# Patient Record
Sex: Male | Born: 1975 | Race: White | Hispanic: No | Marital: Single | Smoking: Current every day smoker
Health system: Southern US, Community
[De-identification: ages and names within clinical notes are randomized; demographics above are authoritative.]

## PROBLEM LIST (undated history)

## (undated) DIAGNOSIS — F1910 Other psychoactive substance abuse, uncomplicated: Secondary | ICD-10-CM

---

## 2016-05-26 ENCOUNTER — Encounter: Payer: Self-pay | Admitting: Emergency Medicine

## 2016-05-26 ENCOUNTER — Emergency Department
Admission: EM | Admit: 2016-05-26 | Discharge: 2016-05-28 | Disposition: A | Discharge status: Home / Self Care | Payer: Self-pay | Attending: Emergency Medicine | Admitting: Emergency Medicine

## 2016-05-26 DIAGNOSIS — F17290 Nicotine dependence, other tobacco product, uncomplicated: Secondary | ICD-10-CM | POA: Insufficient documentation

## 2016-05-26 DIAGNOSIS — Z5181 Encounter for therapeutic drug level monitoring: Secondary | ICD-10-CM | POA: Insufficient documentation

## 2016-05-26 DIAGNOSIS — F1010 Alcohol abuse, uncomplicated: Secondary | ICD-10-CM | POA: Insufficient documentation

## 2016-05-26 DIAGNOSIS — F17220 Nicotine dependence, chewing tobacco, uncomplicated: Secondary | ICD-10-CM | POA: Insufficient documentation

## 2016-05-26 DIAGNOSIS — F17210 Nicotine dependence, cigarettes, uncomplicated: Secondary | ICD-10-CM | POA: Insufficient documentation

## 2016-05-26 DIAGNOSIS — F1910 Other psychoactive substance abuse, uncomplicated: Secondary | ICD-10-CM | POA: Insufficient documentation

## 2016-05-26 DIAGNOSIS — R45851 Suicidal ideations: Secondary | ICD-10-CM

## 2016-05-26 HISTORY — DX: Other psychoactive substance abuse, uncomplicated: F19.10

## 2016-05-26 LAB — COMPREHENSIVE METABOLIC PANEL
ALBUMIN: 4.2 g/dL (ref 3.5–5.0)
ALK PHOS: 61 U/L (ref 38–126)
ALT: 23 U/L (ref 17–63)
AST: 23 U/L (ref 15–41)
Anion gap: 9 (ref 5–15)
BUN: 15 mg/dL (ref 6–20)
CO2: 24 mmol/L (ref 22–32)
CREATININE: 0.79 mg/dL (ref 0.61–1.24)
Calcium: 8.7 mg/dL — ABNORMAL LOW (ref 8.9–10.3)
Chloride: 104 mmol/L (ref 101–111)
GFR calc Af Amer: >60 mL/min (ref >60)
GFR calc non Af Amer: >60 mL/min (ref >60)
GLUCOSE: 138 mg/dL — AB (ref 65–99)
Potassium: 3.4 mmol/L — ABNORMAL LOW (ref 3.5–5.1)
SODIUM: 137 mmol/L (ref 135–145)
Total Bilirubin: 0.6 mg/dL (ref 0.3–1.2)
Total Protein: 7.6 g/dL (ref 6.5–8.1)

## 2016-05-26 LAB — CBC
HCT: 47.2 % (ref 40.0–52.0)
HEMOGLOBIN: 15.8 g/dL (ref 13.0–18.0)
MCH: 26.4 pg (ref 26.0–34.0)
MCHC: 33.4 g/dL (ref 32.0–36.0)
MCV: 79 fL — AB (ref 80.0–100.0)
Platelets: 284 10*3/uL (ref 150–440)
RBC: 5.98 MIL/uL — AB (ref 4.40–5.90)
RDW: 14.4 % (ref 11.5–14.5)
WBC: 13.6 10*3/uL — AB (ref 3.8–10.6)

## 2016-05-26 LAB — URINE DRUG SCREEN, QUALITATIVE (ARMC ONLY)
AMPHETAMINES, UR SCREEN: NOT DETECTED
Barbiturates, Ur Screen: NOT DETECTED
Benzodiazepine, Ur Scrn: NOT DETECTED
CANNABINOID 50 NG, UR ~~LOC~~: NOT DETECTED
COCAINE METABOLITE, UR ~~LOC~~: POSITIVE — AB
MDMA (ECSTASY) UR SCREEN: NOT DETECTED
Methadone Scn, Ur: NOT DETECTED
Opiate, Ur Screen: NOT DETECTED
PHENCYCLIDINE (PCP) UR S: NOT DETECTED
Tricyclic, Ur Screen: NOT DETECTED

## 2016-05-26 LAB — ACETAMINOPHEN LEVEL: Acetaminophen (Tylenol), Serum: <10 ug/mL — ABNORMAL LOW (ref 10–30)

## 2016-05-26 LAB — TROPONIN I: Troponin I: <0.03 ng/mL (ref <0.03)

## 2016-05-26 LAB — SALICYLATE LEVEL: Salicylate Lvl: <7 mg/dL (ref 2.8–30.0)

## 2016-05-26 LAB — ETHANOL: Alcohol, Ethyl (B): 16 mg/dL — ABNORMAL HIGH (ref <5)

## 2016-05-26 NOTE — BH Assessment (Signed)
Assessment Note  Greg Moreno is an 41 y.o. male who presents to the ER due to having thoughts of ending his life. He states, his primary stressors is his substance use. He denies having a plan to end his life but reports of having previous attempts. They occurred more than five years ago. He was sober for approximately two and a half years. He recently relapsed and it has resulted in him several losses. He lost his job, car and his fianc ended their relationship. Patient states, he relapsed on cocaine and alcohol. He was unable to quantify the amount and frequency. He shares he have insight about how his current emotional and mental state is due to the drug use and if he gets help for it, "I'll be okay."  Patient denies having a history of violence and aggression. He also denies current involvement with the legal system and DSS. During the interview, he was calm, cooperative and pleasant. He was able to give appropriate answers for the questions asked.   Diagnosis: Depression and Substance Use Disorder  Past Medical History:  Past Medical History:  Diagnosis Date  . Polysubstance abuse     History reviewed. No pertinent surgical history.  Family History: No family history on file.  Social History:  reports that he has been smoking Cigarettes.  His smokeless tobacco use includes Snuff and Chew. He reports that he drinks alcohol. He reports that he uses drugs, including Cocaine.  Additional Social History:  Alcohol / Drug Use Pain Medications: See PTA Prescriptions: See PTA Over the Counter: See PTA History of alcohol / drug use?: Yes Longest period of sobriety (when/how long): Two and half years Negative Consequences of Use: Personal relationships, Financial, Work / Programmer, multimedia, Armed forces operational officer Substance #1 Name of Substance 1: Alcohol 1 - Last Use / Amount: 05/26/2016 Substance #2 Name of Substance 2: Cocaine 2 - Last Use / Amount: 05/26/2016  CIWA: CIWA-Ar BP: 130/70 Pulse Rate: 97 COWS:     Allergies: No Known Allergies  Home Medications:  (Not in a hospital admission)  OB/GYN Status:  No LMP for male patient.  General Assessment Data Location of Assessment: [facility] ED TTS Assessment: In system Is this a Tele or Face-to-Face Assessment?: Face-to-Face Is this an Initial Assessment or a Re-assessment for this encounter?: Initial Assessment Marital status: Single Maiden name: n/a Is patient pregnant?: No Pregnancy Status: No Living Arrangements: Other (Comment) (Recently broke up with fianc) Can pt return to current living arrangement?: Yes Admission Status: Voluntary Is patient capable of signing voluntary admission?: Yes Referral Source: Self/Family/Friend Insurance type: None  Medical Screening Exam [facility] Walk-in ONLY) Medical Exam completed: Yes  Crisis Care Plan Living Arrangements: Other (Comment) (Recently broke up with fianc) Legal Guardian: Other: (Self) Name of Psychiatrist: Reports of none  Name of Therapist: Reports of none   Education Status Is patient currently in school?: No Current Grade: n/a Highest grade of school patient has completed: High School Diploma Name of school: n/a Contact person: n/a  Risk to self with the past 6 months Suicidal Ideation: Yes-Currently Present Has patient been a risk to self within the past 6 months prior to admission? : Yes Suicidal Intent: No Has patient had any suicidal intent within the past 6 months prior to admission? : No Is patient at risk for suicide?: Yes Suicidal Plan?: No Has patient had any suicidal plan within the past 6 months prior to admission? : No Access to Means: No What has been your use of drugs/alcohol within the  last 12 months?: Cocaine & Alcohol Previous Attempts/Gestures: Yes How many times?: 1 Other Self Harm Risks: Active Addiction Triggers for Past Attempts: Other (Comment) (Active Addiction) Intentional Self Injurious Behavior: None Family Suicide History: No Recent  stressful life event(s): Other (Comment), Loss (Comment), Job Loss, Financial Problems (Active Addiction) Persecutory voices/beliefs?: No Depression: Yes Depression Symptoms: Tearfulness, Isolating, Fatigue, Guilt, Loss of interest in usual pleasures, Feeling worthless/self pity Substance abuse history and/or treatment for substance abuse?: Yes Suicide prevention information given to non-admitted patients: Not applicable  Risk to Others within the past 6 months Homicidal Ideation: No Does patient have any lifetime risk of violence toward others beyond the six months prior to admission? : No Thoughts of Harm to Others: No Current Homicidal Intent: No Current Homicidal Plan: No Access to Homicidal Means: No Identified Victim: Reports of none History of harm to others?: No Assessment of Violence: None Noted Violent Behavior Description: Reports of none Does patient have access to weapons?: No Criminal Charges Pending?: No Does patient have a court date: No Is patient on probation?: No  Psychosis Hallucinations: None noted Delusions: None noted  Mental Status Report Appearance/Hygiene: Unremarkable, In scrubs Eye Contact: Fair Motor Activity: Freedom of movement, Unremarkable Speech: Logical/coherent, Unremarkable Level of Consciousness: Alert Mood: Depressed, Sad, Pleasant, Anxious Affect: Appropriate to circumstance, Depressed, Sad Anxiety Level: Minimal Thought Processes: Coherent, Relevant Judgement: Unimpaired Orientation: Person, Place, Time, Situation, Appropriate for developmental age Obsessive Compulsive Thoughts/Behaviors: Minimal  Cognitive Functioning Concentration: Normal Memory: Recent Intact, Remote Intact IQ: Average Insight: Fair Impulse Control: Fair Appetite: Fair Weight Loss: 0 Weight Gain: 0 Sleep: Decreased Total Hours of Sleep: 0 ("I haven't slept in four") Vegetative Symptoms: None  ADLScreening [facility] Assessment Services) Patient's cognitive  ability adequate to safely complete daily activities?: Yes Patient able to express need for assistance with ADLs?: Yes Independently performs ADLs?: Yes (appropriate for developmental age)  Prior Inpatient Therapy Prior Inpatient Therapy: Yes Prior Therapy Dates: Unable to remember the dates Prior Therapy Facilty/Provider(s): Substance Facility Reason for Treatment: Dual Dx (Depression & Substance Use)  Prior Outpatient Therapy Prior Outpatient Therapy: No Prior Therapy Dates: Reports of none Prior Therapy Facilty/Provider(s): Reports of none Reason for Treatment: Reports of none Does patient have an ACCT team?: No Does patient have Intensive In-House Services?  : No Does patient have [facility] services? : No Does patient have P4CC services?: No  ADL Screening (condition at time of admission) Patient's cognitive ability adequate to safely complete daily activities?: Yes Is the patient deaf or have difficulty hearing?: No Does the patient have difficulty seeing, even when wearing glasses/contacts?: No Does the patient have difficulty concentrating, remembering, or making decisions?: No Patient able to express need for assistance with ADLs?: Yes Does the patient have difficulty dressing or bathing?: No Independently performs ADLs?: Yes (appropriate for developmental age) Does the patient have difficulty walking or climbing stairs?: No Weakness of Legs: None Weakness of Arms/Hands: None  Home Assistive Devices/Equipment Home Assistive Devices/Equipment: None  Therapy Consults (therapy consults require a physician order) PT Evaluation Needed: No OT Evalulation Needed: No SLP Evaluation Needed: No Abuse/Neglect Assessment (Assessment to be complete while patient is alone) Physical Abuse: Denies Verbal Abuse: Denies Sexual Abuse: Denies Exploitation of patient/patient's resources: Denies Self-Neglect: Denies Values / Beliefs Cultural Requests During Hospitalization:  None Spiritual Requests During Hospitalization: None Consults Spiritual Care Consult Needed: No Social Work Consult Needed: No Merchant navy officer (For Healthcare) Does Patient Have a Medical Advance Directive?: No    Additional Information 1:1  In Past 12 Months?: No CIRT Risk: No Elopement Risk: No Does patient have medical clearance?: Yes  Child/Adolescent Assessment Running Away Risk: Denies (Patient is an adult)  Disposition:  Disposition Initial Assessment Completed for this Encounter: Yes Disposition of Patient: Other dispositions  On Site Evaluation by:   Reviewed with Physician:    Lilyan Gilford MS, LCAS, LPC, NCC, CCSI Therapeutic Triage Specialist 05/26/2016 6:55 PM

## 2016-05-26 NOTE — ED Notes (Signed)

## 2016-05-26 NOTE — ED Notes (Signed)
Patient alert and oriented. States he feel "shame and guilt" because of recent relapse. Patient has SI but no active plan.  Patient reports drinking a pint of alcohol daily x one week and also using unknown amount of crack cocaine.  Per patient, no seizures with any previous detox. He does get shaky during withdrawal. Will closely monitor sxs and report to MD as needed.  Patient received meal tray and beverage. He also took a shower.Maintained on 15 minute checks and observation by security camera for safety.

## 2016-05-26 NOTE — ED Notes (Signed)
FIRST NURSE NOTE: Reports recently relapsing, states he has been feeling suicidal for the past 4-5 days. Tearful in triage. States he is currently homeless at this time. Pt states he was dropped off.

## 2016-05-26 NOTE — ED Notes (Signed)
Pt moved to room 22 and report received from [place]. Pt here voluntarily for si after a cocaine/etoh binge. Seen by er doc. telepsych consult ordered.

## 2016-05-26 NOTE — ED Notes (Signed)

## 2016-05-26 NOTE — ED Notes (Signed)
Pt. Alert and oriented, warm and dry, in no distress. Pt. Denies SI, HI, and AVH. Pt. Encouraged to let nursing staff know of any concerns or needs. 

## 2016-05-26 NOTE — ED Triage Notes (Signed)
Pt states approx 8-9 days ago he relapsed on crack cocaine and alcohol. Pt states he binged for approx 4-5 days before going back home. Pt states, "I've lost everything, and I mean, I just don't have any reason to live". Pt states +SI, denies plan at this time. Pt is alert and oriented, calm and cooperative at this time.

## 2016-05-26 NOTE — BH Assessment (Signed)
Referral information for Psychiatric Hospitalization faxed to;    [facility] [phone])   [facility] [phone])

## 2016-05-26 NOTE — ED Provider Notes (Signed)
[facility] Emergency Department Provider Note  ____________________________________________  Time seen: Approximately 2:50 PM  I have reviewed the triage vital signs and the nursing notes.   HISTORY  Chief Complaint Suicidal    HPI Greg Moreno is a 41 y.o. male who reports that he has been abusing alcohol and crack cocaine for the past 8 days. 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 He reports that he has some passing suicidal thoughts, but no plan and no intention to harm himself. No HI or hallucinations. Last cocaine and alcohol were this morning. Also reports that he adamantly is been getting some vague chest pain, most recently about 1 or 2 AM today. No chest pain currently. No shortness of breath vomiting or diaphoresis.     Past Medical History:  Diagnosis Date  . Polysubstance abuse      There are no active problems to display for this patient.    History reviewed. No pertinent surgical history.   Prior to Admission medications   Not on File     Allergies Patient has no known allergies.   No family history on file.  Social History Social History  Substance Use Topics  . Smoking status: Current Every Day Smoker    Types: Cigarettes  . Smokeless tobacco: Current User    Types: Snuff, Chew  . Alcohol use Yes     Comment: 1/5 every day    Review of Systems  Constitutional:   No fever or chills.  ENT:   No sore throat. No rhinorrhea. Cardiovascular:   Positive as above chest pain. Respiratory:   No dyspnea or cough. Gastrointestinal:   Negative for abdominal pain, vomiting and diarrhea.  Genitourinary:   Negative for dysuria or difficulty urinating. Musculoskeletal:   Negative for focal pain or swelling Neurological:   Negative for headaches 10-point ROS  otherwise negative.  ____________________________________________   PHYSICAL EXAM:  VITAL SIGNS: ED Triage Vitals [05/26/16 1349]  Enc Vitals Group     BP 130/85     Pulse Rate 89     Resp 18     Temp 98.2 F (36.8 C)     Temp Source Oral     SpO2 97 %     Weight 270 lb (122.5 kg)     Height 6' (1.829 m)     Head Circumference      Peak Flow      Pain Score 6     Pain Loc      Pain Edu?      Excl. in GC?     Vital signs reviewed, nursing assessments reviewed.   Constitutional:   Alert and oriented. Well appearing and in no distress. Eyes:   No scleral icterus. No conjunctival pallor. PERRL. EOMI.  No nystagmus. ENT   Head:   Normocephalic and atraumatic.   Nose:   No congestion/rhinnorhea. No septal hematoma   Mouth/Throat:   MMM, no pharyngeal erythema. No peritonsillar mass.    Neck:   No stridor. No SubQ emphysema. No meningismus. Hematological/Lymphatic/Immunilogical:   No cervical lymphadenopathy. Cardiovascular:   RRR. Symmetric bilateral radial and DP pulses.  No murmurs.  Respiratory:   Normal respiratory effort without tachypnea nor retractions. Breath sounds are clear and equal bilaterally. No wheezes/rales/rhonchi. Gastrointestinal:   Soft and nontender. Non distended. There is no  CVA tenderness.  No rebound, rigidity, or guarding. Genitourinary:   deferred Musculoskeletal:   Normal range of motion in all extremities. No joint effusions.  No lower extremity tenderness.  No edema. Neurologic:   Normal speech and language.  CN 2-10 normal. Motor grossly intact. No gross focal neurologic deficits are appreciated.  Skin:    Skin is warm, dry and intact. No rash noted.  No petechiae, purpura, or bullae.  ____________________________________________    LABS (pertinent positives/negatives) (all labs ordered are listed, but only abnormal results are displayed) Labs Reviewed  COMPREHENSIVE METABOLIC PANEL - Abnormal; Notable for the following:        Result Value   Potassium 3.4 (*)    Glucose, Bld 138 (*)    Calcium 8.7 (*)    All other components within normal limits  ETHANOL - Abnormal; Notable for the following:    Alcohol, Ethyl (B) 16 (*)    All other components within normal limits  ACETAMINOPHEN LEVEL - Abnormal; Notable for the following:    Acetaminophen (Tylenol), Serum <10 (*)    All other components within normal limits  CBC - Abnormal; Notable for the following:    WBC 13.6 (*)    RBC 5.98 (*)    MCV 79.0 (*)    All other components within normal limits  SALICYLATE LEVEL  URINE DRUG SCREEN, QUALITATIVE ([facility] ONLY)  TROPONIN I   ____________________________________________   EKG    ____________________________________________    RADIOLOGY  No results found.  ____________________________________________   PROCEDURES Procedures  ____________________________________________   INITIAL IMPRESSION / ASSESSMENT AND PLAN / ED COURSE  Pertinent labs & imaging results that were available during my care of the patient were reviewed by me and considered in my medical decision making (see chart for details).  Patient well appearing no acute distress, presents with suicidal thoughts, does not meet commitment criteria, not an imminent danger to himself or others. He agrees to have a psychiatry evaluation today.  Regarding his polysubstance abuse, he denies a history of withdrawal symptoms other than anxiousness. Currently there is no evidence of withdrawal. Vital signs are normal. We'll check troponin and EKG for his chest pain, but a result 12 hours ago so a single troponin will be sufficient.  After EKG troponin and psychiatry evaluation are completed, disposition can be determined. Care be signed out to oncoming physician at 3:00 PM.         ____________________________________________   FINAL CLINICAL IMPRESSION(S) / ED DIAGNOSES  Final diagnoses:  Suicidal thoughts  Polysubstance abuse   Alcohol abuse      New Prescriptions   No medications on file     Portions of this note were generated with dragon dictation software. Dictation errors may occur despite best attempts at proofreading.    Sharman Cheek, MD 05/26/16 1452

## 2016-05-27 DIAGNOSIS — F322 Major depressive disorder, single episode, severe without psychotic features: Secondary | ICD-10-CM

## 2016-05-27 MED ORDER — IBUPROFEN 600 MG PO TABS
ORAL_TABLET | ORAL | Status: AC
  Administered 2016-05-27: 600 mg via ORAL
  Filled 2016-05-27: qty 1

## 2016-05-27 MED ORDER — IBUPROFEN 600 MG PO TABS
600.0000 mg | ORAL_TABLET | Freq: Four times a day (QID) | ORAL | Status: DC | PRN
  Administered 2016-05-27: 600 mg via ORAL

## 2016-05-27 NOTE — ED Notes (Signed)
Patient sleeping when this nurse arrived on unit.  Patient is to be transferred to [facility] once report is called.  Patient resting comfortably.  Requested food earlier in the shift.  Patient recently relapsed on cocaine which resulted in him losing his job, car and fiance.  Patient denies any hx of violence or aggression.  Per report, patient has been calm and cooperative; his behavior has been appropriate.

## 2016-05-27 NOTE — Consult Note (Signed)
[facility]iatry Consult   Reason for Consult:  Consult for 41 year old man with a history of substance abuse came into the hospital with multiple symptoms of depression Referring Physician:  Reita Cliche Patient Identification: Greg Moreno MRN:  [id number] Principal Diagnosis: Severe major depression, single episode, without psychotic features [facility]) Diagnosis:   Patient Active Problem List   Diagnosis Date Noted  . Severe major depression, single episode, without psychotic features ([place]) [F32.2] 05/27/2016  . Cocaine abuse [F14.10] 05/27/2016  . Alcohol abuse [F10.10] 05/27/2016    Total Time spent with patient: 1 hour  Subjective:   Greg Moreno is a 41 y.o. male patient admitted with "I feel hopeless".  HPI:  Patient interviewed chart reviewed. This 41 year old man came to the emergency room over the weekend and was seen by Specialist on call but had not been admitted yet. Reevaluated today the patient says that he had been clean for 2-1/2 years when he relapsed about 10 days ago. He had been drinking heavily and doing crack cocaine for the last 9 days. During that time he lost his job, lost his car, got thrown out of the house by his girlfriend. Describes himself as hopeless. [name] he was having suicidal thoughts with thoughts of killing himself by drug overdose. Denied any psychotic symptoms. Denied any thoughts of violence. Not currently on any psychiatric medicine or getting any outpatient treatment. Sleep is been very poor and appetite is been poor for several days. Patient continues to endorse today feeling hopeless sad and suicidal.  Social history: Previously had been working but lost his job during his binge. Has been living with his "fianc" but she apparently has put him out. Later in the afternoon however she said, round and seems to of changed her mind.  Medical history: Denies knowing of any significant medical history except for chronic joint pain and  arthritis.  Substance abuse history: Describes himself as having had a long history of abuse of drugs and alcohol but being clean for 2-1/2 years before his relapse 9 days ago. No history of DTs or seizures.  Past Psychiatric History: Patient reports he had a suicide attempt several years ago by drug overdose. He says he's been on multiple antidepressants in the past but never thought they were helpful. Despite this he claims he was diagnosed with bipolar disorder. No history of violence to others. No history of psychosis reported.  Risk to Self: Suicidal Ideation: Yes-Currently Present Suicidal Intent: No Is patient at risk for suicide?: Yes Suicidal Plan?: No Access to Means: No What has been your use of drugs/alcohol within the last 12 months?: Cocaine & Alcohol How many times?: 1 Other Self Harm Risks: Active Addiction Triggers for Past Attempts: Other (Comment) (Active Addiction) Intentional Self Injurious Behavior: None Risk to Others: Homicidal Ideation: No Thoughts of Harm to Others: No Current Homicidal Intent: No Current Homicidal Plan: No Access to Homicidal Means: No Identified Victim: Reports of none History of harm to others?: No Assessment of Violence: None Noted Violent Behavior Description: Reports of none Does patient have access to weapons?: No Criminal Charges Pending?: No Does patient have a court date: No Prior Inpatient Therapy: Prior Inpatient Therapy: Yes Prior Therapy Dates: Unable to remember the dates Prior Therapy Facilty/Provider(s): Substance Facility Reason for Treatment: Dual Dx (Depression & Substance Use) Prior Outpatient Therapy: Prior Outpatient Therapy: No Prior Therapy Dates: Reports of none Prior Therapy Facilty/Provider(s): Reports of none Reason for Treatment: Reports of none Does patient have an ACCT team?: No Does  patient have Intensive In-House Services?  : No Does patient have [facility] services? : No Does patient have P4CC services?:  No  Past Medical History:  Past Medical History:  Diagnosis Date  . Polysubstance abuse    History reviewed. No pertinent surgical history. Family History: No family history on file. Family Psychiatric  History: Denies knowing of any family history Social History:  History  Alcohol Use  . Yes    Comment: 1/5 every day     History  Drug Use  . Types: Cocaine    Comment: Pt states smokes crack cocaine    Social History   Social History  . Marital status: Single    Spouse name: N/A  . Number of children: N/A  . Years of education: N/A   Social History Main Topics  . Smoking status: Current Every Day Smoker    Types: Cigarettes  . Smokeless tobacco: Current User    Types: Snuff, Chew  . Alcohol use Yes     Comment: 1/5 every day  . Drug use: Yes    Types: Cocaine     Comment: Pt states smokes crack cocaine  . Sexual activity: Not Asked   Other Topics Concern  . None   Social History Narrative  . None   Additional Social History:    Allergies:  No Known Allergies  Labs:  Results for orders placed or performed during the hospital encounter of 05/26/16 (from the past 48 hour(s))  Comprehensive metabolic panel     Status: Abnormal   Collection Time: 05/26/16  1:51 PM  Result Value Ref Range   Sodium 137 135 - 145 mmol/L   Potassium 3.4 (L) 3.5 - 5.1 mmol/L   Chloride 104 101 - 111 mmol/L   CO2 24 22 - 32 mmol/L   Glucose, Bld 138 (H) 65 - 99 mg/dL   BUN 15 6 - 20 mg/dL   Creatinine, Ser 0.79 0.61 - 1.24 mg/dL   Calcium 8.7 (L) 8.9 - 10.3 mg/dL   Total Protein 7.6 6.5 - 8.1 g/dL   Albumin 4.2 3.5 - 5.0 g/dL   AST 23 15 - 41 U/L   ALT 23 17 - 63 U/L   Alkaline Phosphatase 61 38 - 126 U/L   Total Bilirubin 0.6 0.3 - 1.2 mg/dL   GFR calc non Af Amer >60 >60 mL/min   GFR calc Af Amer >60 >60 mL/min    Comment: (NOTE) The eGFR has been calculated using the CKD EPI equation. This calculation has not been validated in all clinical situations. eGFR's  persistently <60 mL/min signify possible Chronic Kidney Disease.    Anion gap 9 5 - 15  Ethanol     Status: Abnormal   Collection Time: 05/26/16  1:51 PM  Result Value Ref Range   Alcohol, Ethyl (B) 16 (H) <5 mg/dL    Comment:        LOWEST DETECTABLE LIMIT FOR SERUM ALCOHOL IS 5 mg/dL FOR MEDICAL PURPOSES ONLY   Salicylate level     Status: None   Collection Time: 05/26/16  1:51 PM  Result Value Ref Range   Salicylate Lvl <3.5 2.8 - 30.0 mg/dL  Acetaminophen level     Status: Abnormal   Collection Time: 05/26/16  1:51 PM  Result Value Ref Range   Acetaminophen (Tylenol), Serum <10 (L) 10 - 30 ug/mL    Comment:        THERAPEUTIC CONCENTRATIONS VARY SIGNIFICANTLY. A RANGE OF 10-30 ug/mL MAY BE AN  EFFECTIVE CONCENTRATION FOR MANY PATIENTS. HOWEVER, SOME ARE BEST TREATED AT CONCENTRATIONS OUTSIDE THIS RANGE. ACETAMINOPHEN CONCENTRATIONS >150 ug/mL AT 4 HOURS AFTER INGESTION AND >50 ug/mL AT 12 HOURS AFTER INGESTION ARE OFTEN ASSOCIATED WITH TOXIC REACTIONS.   cbc     Status: Abnormal   Collection Time: 05/26/16  1:51 PM  Result Value Ref Range   WBC 13.6 (H) 3.8 - 10.6 K/uL   RBC 5.98 (H) 4.40 - 5.90 MIL/uL   Hemoglobin 15.8 13.0 - 18.0 g/dL   HCT 47.2 40.0 - 52.0 %   MCV 79.0 (L) 80.0 - 100.0 fL   MCH 26.4 26.0 - 34.0 pg   MCHC 33.4 32.0 - 36.0 g/dL   RDW 14.4 11.5 - 14.5 %   Platelets 284 150 - 440 K/uL  Troponin I     Status: None   Collection Time: 05/26/16  1:51 PM  Result Value Ref Range   Troponin I <0.03 <0.03 ng/mL  Urine Drug Screen, Qualitative     Status: Abnormal   Collection Time: 05/26/16  5:00 PM  Result Value Ref Range   Tricyclic, Ur Screen NONE DETECTED NONE DETECTED   Amphetamines, Ur Screen NONE DETECTED NONE DETECTED   MDMA (Ecstasy)Ur Screen NONE DETECTED NONE DETECTED   Cocaine Metabolite,Ur [place] POSITIVE (A) NONE DETECTED   Opiate, Ur Screen NONE DETECTED NONE DETECTED   Phencyclidine (PCP) Ur S NONE DETECTED NONE DETECTED    Cannabinoid 50 Ng, Ur [place] NONE DETECTED NONE DETECTED   Barbiturates, Ur Screen NONE DETECTED NONE DETECTED   Benzodiazepine, Ur Scrn NONE DETECTED NONE DETECTED   Methadone Scn, Ur NONE DETECTED NONE DETECTED    Comment: (NOTE) 297  Tricyclics, urine               Cutoff 1000 ng/mL 200  Amphetamines, urine             Cutoff 1000 ng/mL 300  MDMA (Ecstasy), urine           Cutoff 500 ng/mL 400  Cocaine Metabolite, urine       Cutoff 300 ng/mL 500  Opiate, urine                   Cutoff 300 ng/mL 600  Phencyclidine (PCP), urine      Cutoff 25 ng/mL 700  Cannabinoid, urine              Cutoff 50 ng/mL 800  Barbiturates, urine             Cutoff 200 ng/mL 900  Benzodiazepine, urine           Cutoff 200 ng/mL 1000 Methadone, urine                Cutoff 300 ng/mL 1100 1200 The urine drug screen provides only a preliminary, unconfirmed 1300 analytical test result and should not be used for non-medical 1400 purposes. Clinical consideration and professional judgment should 1500 be applied to any positive drug screen result due to possible 1600 interfering substances. A more specific alternate chemical method 1700 must be used in order to obtain a confirmed analytical result.  1800 Gas chromato graphy / mass spectrometry (GC/MS) is the preferred 1900 confirmatory method.     Current Facility-Administered Medications  Medication Dose Route Frequency Provider Last Rate Last Dose  . ibuprofen (ADVIL,MOTRIN) tablet 600 mg  600 mg Oral Q6H PRN [name], MD   600 mg at 05/27/16 1534   No current outpatient prescriptions on  file.    Musculoskeletal: Strength & Muscle Tone: within normal limits Gait & Station: normal Patient leans: N/A  Psychiatric Specialty Exam: Physical Exam  Nursing note and vitals reviewed. Constitutional: He appears well-developed and well-nourished.  HENT:  Head: Normocephalic and atraumatic.  Eyes: Conjunctivae are normal. Pupils are equal, round, and  reactive to light.  Neck: Normal range of motion.  Cardiovascular: Regular rhythm and normal heart sounds.   Respiratory: Effort normal. No respiratory distress.  GI: Soft.  Musculoskeletal: Normal range of motion.  Neurological: He is alert.  Skin: Skin is warm and dry.  Psychiatric: His speech is delayed. He is slowed. Cognition and memory are normal. He expresses impulsivity. He exhibits a depressed mood. He expresses suicidal ideation. He expresses suicidal plans.    Review of Systems  Constitutional: Negative.   HENT: Negative.   Eyes: Negative.   Respiratory: Negative.   Cardiovascular: Negative.   Gastrointestinal: Negative.   Musculoskeletal: Positive for joint pain.  Skin: Negative.   Neurological: Negative.   Psychiatric/Behavioral: Positive for depression, substance abuse and suicidal ideas. Negative for hallucinations and memory loss. The patient is nervous/anxious and has insomnia.     Blood pressure 124/78, pulse 84, temperature 98.1 F (36.7 C), temperature source Oral, resp. rate 18, height 6' (1.829 m), weight 122.5 kg (270 lb), SpO2 99 %.Body mass index is 36.62 kg/m.  General Appearance: Casual  Eye Contact:  Fair  Speech:  Clear and Coherent  Volume:  Normal  Mood:  Anxious and Depressed  Affect:  Constricted and Tearful  Thought Process:  Goal Directed  Orientation:  Full (Time, Place, and Person)  Thought Content:  Logical  Suicidal Thoughts:  Yes.  with intent/plan  Homicidal Thoughts:  No  Memory:  Immediate;   Good Recent;   Fair Remote;   Fair  Judgement:  Fair  Insight:  Fair  Psychomotor Activity:  Decreased  Concentration:  Concentration: Good  Recall:  Good  Fund of Knowledge:  Good  Language:  Good  Akathisia:  No  Handed:  Right  AIMS (if indicated):     Assets:  Desire for Improvement Resilience  ADL's:  Intact  Cognition:  WNL  Sleep:        Treatment Plan Summary: Daily contact with patient to assess and evaluate symptoms  and progress in treatment, Medication management and Plan 41 year old man presented to the emergency room after a heavy binge on alcohol and cocaine. Continues to report suicidal ideation. Patient had initially been recommended by [facility] for admission to a "substance abuse facility". Such a specific thing of course is really not available for [place]. On reevaluation today however the patient continues to have multiple symptoms of depression with suicidal ideation. He will be admitted to the psychiatry unit downstairs with a diagnosis of major depression. 15 minute checks in place. Full labs will be evaluated. Patient agreeable to plan. Case reviewed with TTS and ER physician.  Disposition: Recommend psychiatric Inpatient admission when medically cleared. Supportive therapy provided about ongoing stressors.  Alethia Berthold, MD 05/27/2016 7:29 PM

## 2016-05-27 NOTE — ED Notes (Signed)
BEHAVIORAL HEALTH ROUNDING  Patient sleeping: No.  Patient alert and oriented: yes  Behavior appropriate: Yes. ; If no, describe:  Nutrition and fluids offered: Yes  Toileting and hygiene offered: Yes  Sitter present: not applicable, Q 15 min safety rounds and observation via security camera. Law enforcement present: Yes ODS  

## 2016-05-27 NOTE — ED Notes (Signed)
Pt has requested for another eval by psych because he is feeling much better and denies si to me at this time

## 2016-05-27 NOTE — ED Notes (Signed)
ENVIRONMENTAL ASSESSMENT  Potentially harmful objects out of patient reach: Yes.  Personal belongings secured: Yes.  Patient dressed in hospital provided attire only: Yes.  Plastic bags out of patient reach: Yes.  Patient care equipment (cords, cables, call bells, lines, and drains) shortened, removed, or accounted for: Yes.  Equipment and supplies removed from bottom of stretcher: Yes.  Potentially toxic materials out of patient reach: Yes.  Sharps container removed or out of patient reach: Yes.   BEHAVIORAL HEALTH ROUNDING  Patient sleeping: No.  Patient alert and oriented: yes  Behavior appropriate: Yes. ; If no, describe:  Nutrition and fluids offered: Yes  Toileting and hygiene offered: Yes  Sitter present: not applicable, Q 15 min safety rounds and observation via security camera. Law enforcement present: Yes ODS   ED BHU PLACEMENT JUSTIFICATION  Is the patient under IVC or is there intent for IVC: No.  Is the patient medically cleared: Yes.  Is there vacancy in the ED BHU: Yes.  Is the population mix appropriate for patient: Yes.  Is the patient awaiting placement in inpatient or outpatient setting: Yes.  Has the patient had a psychiatric consult: Yes.  Survey of unit performed for contraband, proper placement and condition of furniture, tampering with fixtures in bathroom, shower, and each patient room: Yes. ; Findings: All clear  APPEARANCE/BEHAVIOR  calm, cooperative and adequate rapport can be established  NEURO ASSESSMENT  Orientation: time, place and person  Hallucinations: No.None noted (Hallucinations)  Speech: Normal  Gait: normal  RESPIRATORY ASSESSMENT  WNL  CARDIOVASCULAR ASSESSMENT  WNL  GASTROINTESTINAL ASSESSMENT  WNL  EXTREMITIES  WNL  PLAN OF CARE  Provide calm/safe environment. Vital signs assessed TID. ED BHU Assessment once each 12-hour shift. Collaborate with TTS daily or as condition indicates. Assure the ED provider has rounded once each  shift. Provide and encourage hygiene. Provide redirection as needed. Assess for escalating behavior; address immediately and inform ED provider.  Assess family dynamic and appropriateness for visitation as needed: Yes. ; If necessary, describe findings:  Educate the patient/family about BHU procedures/visitation: Yes. ; If necessary, describe findings: Pt is calm and cooperative at this time. Contracts for safety with this RN. Pt understanding and accepting of unit procedures/rules. Will continue to monitor with Q 15 min safety rounds and observation via security camera.

## 2016-05-27 NOTE — ED Provider Notes (Signed)
-----------------------------------------   6:42 AM on 05/27/2016 -----------------------------------------   Blood pressure 126/90, pulse 95, temperature 98.2 F (36.8 C), temperature source Oral, resp. rate 18, height 6' (1.829 m), weight 270 lb (122.5 kg), SpO2 95 %.  The patient had no acute events since last update.  Calm and cooperative at this time.  Disposition is pending Psychiatry/Behavioral Medicine team recommendations.     Irean Hong, MD 05/27/16 [phone]

## 2016-05-28 ENCOUNTER — Inpatient Hospital Stay
Admission: AD | Admit: 2016-05-28 | Discharge: 2016-05-30 | DRG: 881 | Disposition: A | Discharge status: Home / Self Care | Payer: No Typology Code available for payment source | Source: Intra-hospital | Attending: Psychiatry | Admitting: Psychiatry

## 2016-05-28 DIAGNOSIS — Z62810 Personal history of physical and sexual abuse in childhood: Secondary | ICD-10-CM | POA: Diagnosis present

## 2016-05-28 DIAGNOSIS — G4700 Insomnia, unspecified: Secondary | ICD-10-CM | POA: Diagnosis present

## 2016-05-28 DIAGNOSIS — F1420 Cocaine dependence, uncomplicated: Secondary | ICD-10-CM | POA: Diagnosis present

## 2016-05-28 DIAGNOSIS — M1990 Unspecified osteoarthritis, unspecified site: Secondary | ICD-10-CM | POA: Diagnosis present

## 2016-05-28 DIAGNOSIS — F1020 Alcohol dependence, uncomplicated: Secondary | ICD-10-CM | POA: Diagnosis present

## 2016-05-28 DIAGNOSIS — R45851 Suicidal ideations: Secondary | ICD-10-CM | POA: Diagnosis present

## 2016-05-28 DIAGNOSIS — F17210 Nicotine dependence, cigarettes, uncomplicated: Secondary | ICD-10-CM | POA: Diagnosis present

## 2016-05-28 DIAGNOSIS — Z915 Personal history of self-harm: Secondary | ICD-10-CM

## 2016-05-28 DIAGNOSIS — Z560 Unemployment, unspecified: Secondary | ICD-10-CM

## 2016-05-28 DIAGNOSIS — F329 Major depressive disorder, single episode, unspecified: Secondary | ICD-10-CM | POA: Diagnosis present

## 2016-05-28 LAB — LIPID PANEL
CHOL/HDL RATIO: 3.3 ratio
Cholesterol: 137 mg/dL (ref 0–200)
HDL: 42 mg/dL (ref >40)
LDL CALC: 76 mg/dL (ref 0–99)
Triglycerides: 97 mg/dL (ref <150)
VLDL: 19 mg/dL (ref 0–40)

## 2016-05-28 LAB — TSH: TSH: 1.288 u[IU]/mL (ref 0.350–4.500)

## 2016-05-28 MED ORDER — IBUPROFEN 600 MG PO TABS
600.0000 mg | ORAL_TABLET | Freq: Four times a day (QID) | ORAL | Status: DC | PRN
  Administered 2016-05-28: 600 mg via ORAL
  Filled 2016-05-28: qty 1

## 2016-05-28 MED ORDER — NICOTINE 21 MG/24HR TD PT24
21.0000 mg | MEDICATED_PATCH | Freq: Every day | TRANSDERMAL | Status: DC
  Administered 2016-05-28: 21 mg via TRANSDERMAL
  Filled 2016-05-28: qty 1

## 2016-05-28 MED ORDER — ALUM & MAG HYDROXIDE-SIMETH 200-200-20 MG/5ML PO SUSP: 30.0000 mL | ORAL | Status: DC | PRN

## 2016-05-28 MED ORDER — ACETAMINOPHEN 500 MG PO TABS
1000.0000 mg | ORAL_TABLET | Freq: Four times a day (QID) | ORAL | Status: DC | PRN
  Administered 2016-05-29: 1000 mg via ORAL
  Filled 2016-05-28: qty 2

## 2016-05-28 MED ORDER — MELOXICAM 7.5 MG PO TABS
7.5000 mg | ORAL_TABLET | Freq: Two times a day (BID) | ORAL | Status: DC
  Administered 2016-05-28 – 2016-05-30 (×4): 7.5 mg via ORAL
  Filled 2016-05-28 (×4): qty 1

## 2016-05-28 MED ORDER — ACETAMINOPHEN 325 MG PO TABS: 650.0000 mg | ORAL_TABLET | Freq: Four times a day (QID) | ORAL | Status: DC | PRN

## 2016-05-28 MED ORDER — TRAZODONE HCL 100 MG PO TABS
100.0000 mg | ORAL_TABLET | Freq: Every day | ORAL | Status: DC
  Filled 2016-05-28: qty 1

## 2016-05-28 MED ORDER — MAGNESIUM HYDROXIDE 400 MG/5ML PO SUSP
30.0000 mL | Freq: Every day | ORAL | Status: DC | PRN
Start: 2016-05-28 — End: 2016-05-30

## 2016-05-28 NOTE — [facility] Suicide Risk Assessment (Signed)
[facility] INPATIENT:  Family/Significant Other Suicide Prevention Education  Suicide Prevention Education:  Education Completed; Nat Math, fiancee, [phone], has been identified by the patient as the family member/significant other with whom the patient will be residing, and identified as the person(s) who will aid the patient in the event of a mental health crisis (suicidal ideations/suicide attempt).  With written consent from the patient, the family member/significant other has been provided the following suicide prevention education, prior to the and/or following the discharge of the patient.  The suicide prevention education provided includes the following:  Suicide risk factors  Suicide prevention and interventions  National Suicide Hotline telephone number  [facility] assessment telephone number  [facility] Emergency Assistance 911  [facility] and/or Residential Mobile Crisis Unit telephone number  Request made of family/significant other to:  Remove weapons (e.g., guns, rifles, knives), all items previously/currently identified as safety concern.  Herbert Seta reports client has not access to guns that she is aware of.  Remove drugs/medications (over-the-counter, prescriptions, illicit drugs), all items previously/currently identified as a safety concern.  The family member/significant other verbalizes understanding of the suicide prevention education information provided.  The family member/significant other agrees to remove the items of safety concern listed above.  Lorri Frederick, LCSW 05/28/2016, 4:12 PM

## 2016-05-28 NOTE — [facility] Group Notes (Signed)
[facility] LCSW Group Therapy Note  Date/Time: 05/28/16, 0930  Type of Therapy/Topic:  Group Therapy:  Feelings about Diagnosis  Participation Level:  Did Not Attend   Mood:   Description of Group:    This group will allow patients to explore their thoughts and feelings about diagnoses they have received. Patients will be guided to explore their level of understanding and acceptance of these diagnoses. Facilitator will encourage patients to process their thoughts and feelings about the reactions of others to their diagnosis, and will guide patients in identifying ways to discuss their diagnosis with significant others in their lives. This group will be process-oriented, with patients participating in exploration of their own experiences as well as giving and receiving support and challenge from other group members.   Therapeutic Goals: 1. Patient will demonstrate understanding of diagnosis as evidence by identifying two or more symptoms of the disorder:  2. Patient will be able to express two feelings regarding the diagnosis 3. Patient will demonstrate ability to communicate their needs through discussion and/or role plays  Summary of Patient Progress:        Therapeutic Modalities:   Cognitive Behavioral Therapy Brief Therapy Feelings Identification   Greg Pearlene Teat, LCSW 

## 2016-05-28 NOTE — Progress Notes (Signed)
Recreation Therapy Notes  Date: 04.17.18 Time: 3"00 pm Location: Craft Room  Group Topic: Communication  Goal Area(s) Addresses:  Patient will use effective communication skills. Patient will verbalize importance of using communication skills post d/c.  Behavioral Response: Did not attend   Intervention: Blind Drawing  Activity: Patients were put in pairs back-to-back. One patient was given a picture and was instructed to give the other patient directions on how to draw the picture without saying what the picture was. After both patients had a change at giving directions, patients were able to do a second round when they were side-by-side instead of back-to-back.  Education: LRT educated patients on Manufacturing systems engineer.  Education Outcome: Patient did not attend group.  Clinical Observations/Feedback: Patient left with Dr. Ardyth Harps when group started. Patient did not return to group.  Jacquelynn Cree, LRT/CTRS 05/28/2016 3:42 PM

## 2016-05-28 NOTE — ED Notes (Signed)
Report given to Abbie at [facility].  Patient will be discharged from [facility].  He will be transported via wheelchair to BMU with police escort.  Patient is calm and cooperative.  His behavior is appropriate.  He will be going to room #312 BMU under the care of Dr. Michel Santee.

## 2016-05-28 NOTE — [facility] Group Notes (Signed)
[facility] Group Notes:  (Nursing/MHT/Case Management/Adjunct)  Date:  05/28/2016  Time:  11:16 PM  Type of Therapy:  Psychoeducational Skills  Participation Level:  Did Not Attend  Summary of Progress/Problems:  Foy Gu[place] 05/28/2016, 11:16 PM

## 2016-05-28 NOTE — Plan of Care (Signed)
Problem: Activity: Goal: Sleeping patterns will improve Outcome: Progressing Patient slept for Estimated Hours of 2.45; every 15 minutes safety round maintained, no injury or falls during this shift.

## 2016-05-28 NOTE — Progress Notes (Signed)
Pt did initial phone screening for [facility], who reports no beds currently.  They encouraged pt to call daily to check on bed status. Garner Nash, MSW, LCSW Clinical Social Worker 05/28/2016 2:59 PM

## 2016-05-28 NOTE — Progress Notes (Signed)
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; found another job and lost his job; lost his car; finances; lost relationships; relapse)  Coping Skills:   Isolate, Substance Abuse, Exercise, Art/Dance, Talking, Music, Other (Comment) (Journal, quiet time with God)  Personal Challenges: Concentration, Decision-Making, Expressing Yourself, Self-Esteem/Confidence, Stress Management, Trusting Others  Leisure Interests (2+):  [employer], Individual - Other (Comment) (Spend time with family)  Awareness of Community Resources:  No  Community Resources:     Current Use:    If no, Barriers?:    Patient Strengths:  Big heart, empathy towards others  Patient Identified Areas of Improvement:  How to cope with life, how to confide to others  Current Recreation Participation:  Not much  Patient Goal for Hospitalization:  To find hope again  [place] of Residence:  [place] of Residence:  [place]   Current SI (including self-harm):  No  Current HI:  No  Consent to Intern Participation: N/A   Jacquelynn Cree, LRT/CTRS 05/28/2016, 4:49 PM

## 2016-05-28 NOTE — Progress Notes (Signed)
Denies SI/HI/AVH.  Affect sad.  Verbalized that he prefers to be called Greg Moreno.  Isolative.  Minimal interaction.  No group attendance.  Maintains personal care chores.  Good appetite.  Support offered.  Safety checks maintained.
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
## 2016-05-28 NOTE — Progress Notes (Signed)
Chaplain responded to an order which patient had suicidal thoughts. 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 Patient said that he was at the top of his game until his employer started asking him to work many hours from 5AM to 10PM. Patient said that he became burnout due to his restless work. When he got burnout, patient sought self-help from cocaine which was a relapse because he was drug addicts before. Patient said that the mess he created ruined his career and a marriage he was going to do last week. Chaplain reassured patient that God he served is  Paramedic God who gives people a second chance and many chances in life as long as they are still alive. Chaplain prayed with patient and patient appreciated the visit.   05/28/16 1100  Clinical Encounter Type  Visited With Patient  Visit Type Initial  Referral From Nurse  Consult/Referral To Chaplain  Spiritual Encounters  Spiritual Needs Prayer

## 2016-05-28 NOTE — Plan of Care (Signed)
Problem: Safety: Goal: Ability to remain free from injury will improve Outcome: Progressing Pt safe on the unit at this time   

## 2016-05-28 NOTE — [facility] Group Notes (Signed)
[facility] Group Notes:  (Nursing/MHT/Case Management/Adjunct)  Date:  05/28/2016  Time:  6:06 PM  Type of Therapy:  Psychoeducational Skills  Participation Level:  Did Not Attend  Greg Moreno 05/28/2016, 6:06 PM

## 2016-05-28 NOTE — Progress Notes (Signed)
D: Pt denies SI/HI/AVH. Pt is pleasant and cooperative. Pt seen on the unit acting appropriately  And pt had visitors and seemed happy after the visit.   A: Pt was offered support and encouragement. Pt was given scheduled medications. Pt was encourage to attend groups. Q 15 minute checks were done for safety.  R:Pt attends groups and interacts well with peers and staff. Pt is taking medication. Pt has no complaints.Pt receptive to treatment and safety maintained on unit.

## 2016-05-28 NOTE — Tx Team (Signed)
Initial Treatment Plan 05/28/2016 4:09 AM Greg Moreno UEA:[id number]    PATIENT STRESSORS: Financial difficulties Substance abuse   PATIENT STRENGTHS: Ability for insight Average or above average intelligence Capable of independent living Motivation for treatment/growth   PATIENT IDENTIFIED PROBLEMS: Mood Instability  Suicidal Thoughts  Substance Abuse  Homelessness               DISCHARGE CRITERIA:  Ability to meet basic life and health needs Improved stabilization in mood, thinking, and/or behavior Motivation to continue treatment in a less acute level of care  PRELIMINARY DISCHARGE PLAN: Attend aftercare/continuing care group Attend 12-step recovery group Placement in alternative living arrangements  PATIENT/FAMILY INVOLVEMENT: This treatment plan has been presented to and reviewed with the patient, Greg Moreno  The patient and family have been given the opportunity to ask questions and make suggestions.  Cleotis Nipper, RN 05/28/2016, 4:09 AM

## 2016-05-28 NOTE — Progress Notes (Signed)
Patient ID: Greg Moreno, male   DOB: 08-11-1975, 41 y.o.   MRN: [id number] Pleasant on arrival until Skin Assessment & Contrabands Search, "I am not homo .... Why do I need to take it off...." Explanation provided; no tattoos, no open skin, gross skin intact; no contraband found on patient and on his belongings. Frustrated, "I have been here for the past 2 days until now, ..." Patient was allowed to vent, has never been admitted to any psych unit before; became polite, pleasant by the end of assessment; unit orientation provided, Ibuprofen 600 mg given for 5/10 generalized pain after snacks and beverages; allowed to room.

## 2016-05-28 NOTE — [facility] Suicide Risk Assessment (Signed)
[facility] Admission Suicide Risk Assessment   Nursing information obtained from:  Patient, Review of record Demographic factors:  Male, Caucasian, Low socioeconomic status, Living alone, Unemployed Current Mental Status:  Self-harm thoughts Loss Factors:  Financial problems / change in socioeconomic status Historical Factors:  NA Risk Reduction Factors:  Religious beliefs about death  Total Time spent with patient: 1 hour Principal Problem: Depression Diagnosis:   Patient Active Problem List   Diagnosis Date Noted  . Unspecified depressive disorder [F32.9] 05/28/2016  . Cocaine use disorder, severe, dependence (HCC) [F14.20] 05/28/2016  . Alcohol use disorder, severe, dependence (HCC) [F10.20] 05/28/2016   Subjective Data:   Continued Clinical Symptoms:  Alcohol Use Disorder Identification Test Final Score (AUDIT): 15 The "Alcohol Use Disorders Identification Test", Guidelines for Use in Primary Care, Second Edition.  World Science writer [facility]). Score between 0-7:  no or low risk or alcohol related problems. Score between 8-15:  moderate risk of alcohol related problems. Score between 16-19:  high risk of alcohol related problems. Score 20 or above:  warrants further diagnostic evaluation for alcohol dependence and treatment.   CLINICAL FACTORS:   Alcohol/Substance Abuse/Dependencies More than one psychiatric diagnosis Previous Psychiatric Diagnoses and Treatments    Psychiatric Specialty Exam: Physical Exam  ROS  Blood pressure 134/86, pulse 78, temperature 98.3 F (36.8 C), temperature source Oral, resp. rate 18, height 6' (1.829 m), weight 119.3 kg (263 lb), SpO2 100 %.Body mass index is 35.67 kg/m.                                                    Sleep:  Number of Hours: 2.45      COGNITIVE FEATURES THAT CONTRIBUTE TO RISK:  None    SUICIDE RISK:   Moderate:  Frequent suicidal ideation with limited intensity, and duration, some  specificity in terms of plans, no associated intent, good self-control, limited dysphoria/symptomatology, some risk factors present, and identifiable protective factors, including available and accessible social support.  PLAN OF CARE: admit to [facility]  I certify that inpatient services furnished can reasonably be expected to improve the patient's condition.   Jimmy Footman, MD 05/28/2016, 4:09 PM
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
## 2016-05-29 LAB — C-REACTIVE PROTEIN: CRP: 1.3 mg/dL — AB (ref <1.0)

## 2016-05-29 LAB — HEMOGLOBIN A1C
Hgb A1c MFr Bld: 5.5 % (ref 4.8–5.6)
MEAN PLASMA GLUCOSE: 111 mg/dL

## 2016-05-29 LAB — SEDIMENTATION RATE: SED RATE: 4 mm/h (ref 0–15)

## 2016-05-29 LAB — VITAMIN B12: VITAMIN B 12: 316 pg/mL (ref 180–914)

## 2016-05-29 MED ORDER — DIPHENHYDRAMINE HCL 25 MG PO CAPS
50.0000 mg | ORAL_CAPSULE | Freq: Every day | ORAL | Status: DC
  Administered 2016-05-29: 50 mg via ORAL
  Filled 2016-05-29: qty 2

## 2016-05-29 MED ORDER — OXYMETAZOLINE HCL 0.05 % NA SOLN
2.0000 | Freq: Two times a day (BID) | NASAL | Status: DC | PRN
  Administered 2016-05-29 – 2016-05-30 (×3): 2 via NASAL
  Filled 2016-05-29: qty 15

## 2016-05-29 MED ORDER — NICOTINE 21 MG/24HR TD PT24
21.0000 mg | MEDICATED_PATCH | Freq: Every day | TRANSDERMAL | Status: DC
  Administered 2016-05-29 – 2016-05-30 (×2): 21 mg via TRANSDERMAL
  Filled 2016-05-29 (×2): qty 1

## 2016-05-29 MED ORDER — PSEUDOEPHEDRINE HCL ER 120 MG PO TB12
120.0000 mg | ORAL_TABLET | Freq: Two times a day (BID) | ORAL | Status: DC
  Administered 2016-05-29 – 2016-05-30 (×2): 120 mg via ORAL
  Filled 2016-05-29 (×2): qty 1

## 2016-05-29 MED ORDER — LORATADINE 10 MG PO TABS
10.0000 mg | ORAL_TABLET | Freq: Every day | ORAL | Status: DC
  Administered 2016-05-29 – 2016-05-30 (×2): 10 mg via ORAL
  Filled 2016-05-29 (×2): qty 1

## 2016-05-29 NOTE — Progress Notes (Signed)
CSW spoke with Greg Moreno at [facility] in [place].  [phone]  Pt had applied for admission but had stopped calling daily as is the protocol and has been taken off the wait list.  They can reinstate him to the wait list and pt should call when he has an idea for date of discharge. Garner Nash, MSW, LCSW Clinical Social Worker 05/29/2016 1:31 PM

## 2016-05-29 NOTE — Progress Notes (Signed)
Recreation Therapy Notes  Date: 04.18.18 Time: 1:00 pm Location: Craft Room  Group Topic: Self-esteem  Goal Area(s) Addresses:  Patient will be able to identify benefit of having a healthy self-esteem. Patient will be able to identify ways to increase self-esteem.  Behavioral Response: Attentive, Interactive  Intervention: Self-Portrait  Activity: Patients were given a blank face worksheet and were instructed to draw their self-portrait of how they were feeling. Patients were given paper and were instructed to write a positive trait about themselves and their peers. Patients were given another blank face worksheet and were instructed to draw their self-portrait based on how they felt after reading positive comments from peers.  Education: LRT educated patients on ways they can increase their self-esteem.  Education Outcome: Acknowledges education/In group clarification offered  Clinical Observations/Feedback: Patient drew both self-portraits and wrote a positive trait about self and peers. Patient contributed to group discussion by stating why he thinks more negatively.  Jacquelynn Cree, LRT/CTRS 05/29/2016 2:00 PM

## 2016-05-29 NOTE — Plan of Care (Signed)
Problem: Health Behavior/Discharge Planning: Goal: Identification of resources available to assist in meeting health care needs will improve Outcome: Progressing Pt voices working closely with SW to arrange for inpatient rehab treatment once discharged.

## 2016-05-29 NOTE — Progress Notes (Signed)
[facility] MD Progress Note  05/29/2016 3:45 PM Greg Moreno  MRN:  [id number] Subjective:  Patient is a 41 year old single Caucasian male from [facility]. The patient presented voluntarily to our emergency department on April 15. At that time he reported severe depression and suicidal thoughts due to having relapsed on cocaine and alcohol.  Patient reports that he is still struggling with substance abuse around the age of 72. He has abused a multitude of substances over the years. He has been hospitalized many times before and has been in substance abuse treatment in the past. 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  Patient reports that the job was very demanding and after 2-1/2 years he decided to leave due to burn out. 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  He has been living with her since February. He unfortunately relapsed on cocaine and alcohol this past week, with prior to his wedding. He lost his job, sold his car and now he fears that he might not be welcome back to live with his fiance.  Patient is states that he wants to the point where he was about to commit a crime. He decided to come to the emergency department instead of closing that line.  4/18 patient reports that he didn't sleep well at all last night. He woke up multiple times. He feels he is sick, he complains of having drainage, nasal congestion, and earache. His mood still depressed and feels very anxious about the discharge plan as he does not know where he is going to go. Patient has been participating in groups. Patient has not displayed any disruptive or unsafe behavior. He denies suicidality, homicidality or hallucinations. Still prefers not to start any medications for depression. He is interested in starting psychotherapy for treatment of depression at this point  Per nursing: Pt denies SI/HI/AVH.  Pt is pleasant and cooperative. Pt seen on the unit acting appropriately  And pt had visitors and seemed happy after the visit. Pt attends groups and interacts well with peers and staff. Pt is taking medication. Pt has no complaints.Pt receptive to treatment and safety maintained on unit.  Principal Problem: Depression Diagnosis:   Patient Active Problem List   Diagnosis Date Noted  . Unspecified depressive disorder [F32.9] 05/28/2016  . Cocaine use disorder, severe, dependence (HCC) [F14.20] 05/28/2016  . Alcohol use disorder, severe, dependence (HCC) [F10.20] 05/28/2016   Total Time spent with patient: 30 minutes  Past Psychiatric History:Patient has had 3 or 4 psychiatric hospitalizations in the past. He has been diagnosed with depression along with the substance abuse issues. He has been tried on Paxil plus Abilify with no benefit, he also tried Prozac and Zoloft that make him feel numb.  He reports one prior suicidal attempt after 07/09/2001 when her mother passed away. He tried to overdose on cocaine. He denies any history of self injury  Past Medical History:  Past Medical History:  Diagnosis Date  . Polysubstance abuse    History reviewed. No pertinent surgical history.  Family History: History reviewed. No pertinent family history.  Family Psychiatric  History: Patient denies any family history of mental illness or substance abuse  Social History:  Patient is single, never married, doesn't have any children, currently unemployed. He has a 12th grade education. No military experience. 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  Patient has felony charges for larceny. He was incarcerated at the time for 30 days. No pending charges at this time History  Alcohol Use  . Yes    Comment: 1/5 every day     History  Drug Use  . Types: Cocaine    Comment: Pt states smokes crack cocaine    Social History   Social History  .  Marital status: Single    Spouse name: N/A  . Number of children: N/A  . Years of education: N/A   Social History Main Topics  . Smoking status: Current Every Day Smoker    Types: Cigarettes  . Smokeless tobacco: Current User    Types: Snuff, Chew  . Alcohol use Yes     Comment: 1/5 every day  . Drug use: Yes    Types: Cocaine     Comment: Pt states smokes crack cocaine  . Sexual activity: Not Asked   Other Topics Concern  . None   Social History Narrative  . None    Current Medications: Current Facility-Administered Medications  Medication Dose Route Frequency Provider Last Rate Last Dose  . acetaminophen (TYLENOL) tablet 1,000 mg  1,000 mg Oral Q6H PRN Jimmy Footman, MD   1,000 mg at 05/29/16 1522  . alum & mag hydroxide-simeth (MAALOX/MYLANTA) 200-200-20 MG/5ML suspension 30 mL  30 mL Oral Q4H PRN Audery Amel, MD      . diphenhydrAMINE (BENADRYL) capsule 50 mg  50 mg Oral QHS Jimmy Footman, MD      . loratadine (CLARITIN) tablet 10 mg  10 mg Oral Daily Jimmy Footman, MD   10 mg at 05/29/16 1226  . magnesium hydroxide (MILK OF MAGNESIA) suspension 30 mL  30 mL Oral Daily PRN Audery Amel, MD      . meloxicam (MOBIC) tablet 7.5 mg  7.5 mg Oral BID Jimmy Footman, MD   7.5 mg at 05/29/16 0825  . nicotine (NICODERM CQ - dosed in mg/24 hours) patch 21 mg  21 mg Transdermal Daily Jimmy Footman, MD   21 mg at 05/29/16 0936  . oxymetazoline (AFRIN) 0.05 % nasal spray 2 spray  2 spray Each Nare BID PRN Jimmy Footman, MD   2 spray at 05/29/16 1226  . pseudoephedrine (SUDAFED) 12 hr tablet 120 mg  120 mg Oral BID Jimmy Footman, MD        Lab Results:  Results for orders placed or performed during the hospital encounter of 05/28/16 (from the past 48 hour(s))  Hemoglobin A1c     Status: None   Collection Time: 05/28/16  7:30 AM  Result Value Ref Range   Hgb A1c MFr Bld 5.5 4.8 - 5.6 %    Comment:  (NOTE)         Pre-diabetes: 5.7 - 6.4         Diabetes: >6.4         Glycemic control for adults with diabetes: <7.0    Mean Plasma Glucose 111 mg/dL    Comment: (NOTE) Performed At: [facility] [address] [place], [place] [id number] Mila Homer MD NW:[id number]   Lipid panel     Status: None   Collection Time: 05/28/16  7:30 AM  Result Value Ref Range   Cholesterol 137 0 - 200 mg/dL   Triglycerides 97 <578 mg/dL   HDL 42 >46 mg/dL   Total CHOL/HDL Ratio 3.3 RATIO   VLDL 19 0 - 40 mg/dL   LDL Cholesterol 76  0 - 99 mg/dL    Comment:        Total Cholesterol/HDL:CHD Risk Coronary Heart Disease Risk Table                     Men   Women  1/2 Average Risk   3.4   3.3  Average Risk       5.0   4.4  2 X Average Risk   9.6   7.1  3 X Average Risk  23.4   11.0        Use the calculated Patient Ratio above and the CHD Risk Table to determine the patient's CHD Risk.        ATP III CLASSIFICATION (LDL):  <100     mg/dL   Optimal  161-096  mg/dL   Near or Above                    Optimal  130-159  mg/dL   Borderline  045-409  mg/dL   High  >811     mg/dL   Very High   TSH     Status: None   Collection Time: 05/28/16  7:30 AM  Result Value Ref Range   TSH 1.288 0.350 - 4.500 uIU/mL    Comment: Performed by a 3rd Generation assay with a functional sensitivity of <=0.01 uIU/mL.  Sedimentation rate     Status: None   Collection Time: 05/29/16 12:15 PM  Result Value Ref Range   Sed Rate 4 0 - 15 mm/hr    Blood Alcohol level:  Lab Results  Component Value Date   ETH 16 (H) 05/26/2016    Metabolic Disorder Labs: Lab Results  Component Value Date   HGBA1C 5.5 05/28/2016   MPG 111 05/28/2016   No results found for: PROLACTIN Lab Results  Component Value Date   CHOL 137 05/28/2016   TRIG 97 05/28/2016   HDL 42 05/28/2016   CHOLHDL 3.3 05/28/2016   VLDL 19 05/28/2016   LDLCALC 76 05/28/2016    Physical Findings: AIMS:  , ,  ,  ,    CIWA:     COWS:     Musculoskeletal: Strength & Muscle Tone: within normal limits Gait & Station: normal Patient leans: N/A  Psychiatric Specialty Exam: Physical Exam  Constitutional: He is oriented to person, place, and time. He appears well-developed and well-nourished.  HENT:  Head: Normocephalic and atraumatic.  Eyes: Conjunctivae and EOM are normal.  Neck: Normal range of motion.  Respiratory: Effort normal.  Musculoskeletal: Normal range of motion.  Neurological: He is alert and oriented to person, place, and time.    Review of Systems  Constitutional: Negative.   HENT: Positive for congestion, ear pain and sinus pain.   Eyes: Negative.   Respiratory: Negative.   Cardiovascular: Negative.   Gastrointestinal: Negative.   Genitourinary: Negative.   Neurological: Negative.   Endo/Heme/Allergies: Negative.   Psychiatric/Behavioral: Positive for depression and substance abuse. Negative for hallucinations and suicidal ideas. The patient is nervous/anxious and has insomnia.     Blood pressure 131/79, pulse 78, temperature 98.4 F (36.9 C), temperature source Oral, resp. rate 19, height 6' (1.829 m), weight 119.3 kg (263 lb), SpO2 100 %.Body mass index is 35.67 kg/m.  General Appearance: Well Groomed  Eye Contact:  Good  Speech:  Clear and Coherent  Volume:  Normal  Mood:  Dysphoric  Affect:  Appropriate and Congruent  Thought Process:  Linear and Descriptions of  Associations: Intact  Orientation:  Full (Time, Place, and Person)  Thought Content:  Hallucinations: None  Suicidal Thoughts:  No  Homicidal Thoughts:  No  Memory:  Immediate;   Good Recent;   Good Remote;   Good  Judgement:  Fair  Insight:  Fair  Psychomotor Activity:  Decreased  Concentration:  Concentration: Fair and Attention Span: Fair  Recall:  Good  Fund of Knowledge:  Good  Language:  Good  Akathisia:  No  Handed:    AIMS (if indicated):     Assets:  Communication Skills Physical Health  ADL's:   Intact  Cognition:  WNL  Sleep:  Number of Hours: 8     Treatment Plan Summary: 41 year old single Caucasian male with history of depression along with substance abuse. The patient presents to the emergency department with suicidal ideation in the setting of recent relapse on alcohol and cocaine.  Unspecified depressive disorder: Patient is currently not meeting criteria for major depressive disorder. His depressive symptoms have not been present for longer than one week.   Patient reports that prior to relapse his mood was euthymic and was not having any signs of depression.   Patient has tried antidepressants in the past and he is not interested in starting treatment with any of them as he feels that they have not been beneficial and have make him feel numb.  Patient is more interested in starting individual therapy upon discharge. Patient says he identifies significant patterns in his life and he needs to be able to work on those  Insomnia: I will discontinue trazodone as it did not help the patient sleep last night. Instead I will Then to 50 mg by mouth daily at bedtime  Cocaine and alcohol use disorder the patient is currently not showing any evidence of alcohol withdrawal. He is interested in residential treatment for substance abuse.  Patient complains of having chronic joint pain along with tingling and numbness. We will try to rule out from a rheumatologic  diseases. I also will check vitamin B12 and TSH. Continue Mobic 7.5 mg twice a day  Diet regular  Precautions every 15 minute checks  Hospitalization status voluntary  Labs we will be checking B12 pending, TSH wnl. Sedimentation rate is within the normal limits. Pending ANA and protein C reactive  Disposition will try to look for residential treatment for substance abuse--SW will contact [facility] and another program pt has been in contact with in [facility]  Follow-up to be determined.   Jimmy Footman,  MD 05/29/2016, 3:45 PM

## 2016-05-29 NOTE — Progress Notes (Signed)
Pt awake, alert, oriented and up on unit complaining of sinus congestion. Denies SI/HI/AVH. Pleasant, calm and cooperative on unit. Appears somewhat anxious at times. Appropriately interacts with staff/peers. Brightens on approach. Pt did attend some groups. Medication compliant including those to help with sinus congestion. Support and encouragement provided. Medications administered as ordered with education as ordered. Safety maintained with every 15 minute checks. Will continue to monitor.

## 2016-05-29 NOTE — [facility] Group Notes (Signed)
[facility] LCSW Group Therapy 05/29/2016 9:30 AM  Type of Therapy: Group Therapy- Emotion Regulation  Participation Level: Pt invited. Did not attend.  Jonathon Jordan, MSW, LCSWA 05/29/2016 10:29 AM

## 2016-05-29 NOTE — Tx Team (Addendum)
Interdisciplinary Treatment and Diagnostic Plan Update  05/29/2016 Time of Session: [name] MRN: [id number]  Principal Diagnosis: Depression  Secondary Diagnoses: Principal Problem:   Unspecified depressive disorder Active Problems:   Cocaine use disorder, severe, dependence (HCC)   Alcohol use disorder, severe, dependence (HCC)   Current Medications:  Current Facility-Administered Medications  Medication Dose Route Frequency Provider Last Rate Last Dose  . acetaminophen (TYLENOL) tablet 1,000 mg  1,000 mg Oral Q6H PRN Jimmy Footman, MD      . alum & mag hydroxide-simeth (MAALOX/MYLANTA) 200-200-20 MG/5ML suspension 30 mL  30 mL Oral Q4H PRN Audery Amel, MD      . diphenhydrAMINE (BENADRYL) capsule 50 mg  50 mg Oral QHS Jimmy Footman, MD      . loratadine (CLARITIN) tablet 10 mg  10 mg Oral Daily Jimmy Footman, MD   10 mg at 05/29/16 1226  . magnesium hydroxide (MILK OF MAGNESIA) suspension 30 mL  30 mL Oral Daily PRN Audery Amel, MD      . meloxicam (MOBIC) tablet 7.5 mg  7.5 mg Oral BID Jimmy Footman, MD   7.5 mg at 05/29/16 0825  . nicotine (NICODERM CQ - dosed in mg/24 hours) patch 21 mg  21 mg Transdermal Daily Jimmy Footman, MD   21 mg at 05/29/16 0936  . oxymetazoline (AFRIN) 0.05 % nasal spray 2 spray  2 spray Each Nare BID PRN Jimmy Footman, MD   2 spray at 05/29/16 1226  . pseudoephedrine (SUDAFED) 12 hr tablet 120 mg  120 mg Oral BID Jimmy Footman, MD       PTA Medications: No prescriptions prior to admission.    Patient Stressors: Financial difficulties Substance abuse  Patient Strengths: Ability for insight Average or above average intelligence Capable of independent living Motivation for treatment/growth  Treatment Modalities: Medication Management, Group therapy, Case management,  1 to 1 session with clinician, Psychoeducation, Recreational  therapy.   Physician Treatment Plan for Primary Diagnosis: Depression Long Term Goal(s): Improvement in symptoms so as ready for discharge Improvement in symptoms so as ready for discharge   Short Term Goals: Ability to identify changes in lifestyle to reduce recurrence of condition will improve Ability to disclose and discuss suicidal ideas Ability to demonstrate self-control will improve Ability to identify and develop effective coping behaviors will improve Ability to identify triggers associated with substance abuse/mental health issues will improve Ability to identify changes in lifestyle to reduce recurrence of condition will improve  Medication Management: Evaluate patient's response, side effects, and tolerance of medication regimen.  Therapeutic Interventions: 1 to 1 sessions, Unit Group sessions and Medication administration.  Evaluation of Outcomes: Progressing  Physician Treatment Plan for Secondary Diagnosis: Principal Problem:   Unspecified depressive disorder Active Problems:   Cocaine use disorder, severe, dependence (HCC)   Alcohol use disorder, severe, dependence (HCC)  Long Term Goal(s): Improvement in symptoms so as ready for discharge Improvement in symptoms so as ready for discharge   Short Term Goals: Ability to identify changes in lifestyle to reduce recurrence of condition will improve Ability to disclose and discuss suicidal ideas Ability to demonstrate self-control will improve Ability to identify and develop effective coping behaviors will improve Ability to identify triggers associated with substance abuse/mental health issues will improve Ability to identify changes in lifestyle to reduce recurrence of condition will improve     Medication Management: Evaluate patient's response, side effects, and tolerance of medication regimen.  Therapeutic Interventions: 1 to 1 sessions, Unit Group sessions  and Medication administration.  Evaluation of  Outcomes: Progressing   RN Treatment Plan for Primary Diagnosis: Depression Long Term Goal(s): Knowledge of disease and therapeutic regimen to maintain health will improve  Short Term Goals: Ability to demonstrate self-control and Compliance with prescribed medications will improve  Medication Management: RN will administer medications as ordered by provider, will assess and evaluate patient's response and provide education to patient for prescribed medication. RN will report any adverse and/or side effects to prescribing provider.  Therapeutic Interventions: 1 on 1 counseling sessions, Psychoeducation, Medication administration, Evaluate responses to treatment, Monitor vital signs and CBGs as ordered, Perform/monitor CIWA, COWS, AIMS and Fall Risk screenings as ordered, Perform wound care treatments as ordered.  Evaluation of Outcomes: Progressing   LCSW Treatment Plan for Primary Diagnosis: Depression Long Term Goal(s): Safe transition to appropriate next level of care at discharge, Engage patient in therapeutic group addressing interpersonal concerns.  Short Term Goals: Engage patient in aftercare planning with referrals and resources, Increase social support and Increase skills for wellness and recovery  Therapeutic Interventions: Assess for all discharge needs, 1 to 1 time with Social worker, Explore available resources and support systems, Assess for adequacy in community support network, Educate family and significant other(s) on suicide prevention, Complete Psychosocial Assessment, Interpersonal group therapy.  Evaluation of Outcomes: Progressing    Recreational Therapy Treatment Plan for Primary Diagnosis: Depression Long Term Goal(s): Patient will participate in recreation therapy treatment in at least 2 group sessions without prompting from LRT  Short Term Goals: Increase self-esteem, Increase stress management skills  Treatment Modalities: Group Therapy and Individual  Treatment Sessions  Therapeutic Interventions: Psychoeducation  Evaluation of Outcomes: Progressing   Progress in Treatment: Attending groups: No. Participating in groups: No. Taking medication as prescribed: Yes. Toleration medication: Yes. Family/Significant other contact made: Yes, individual(s) contacted:  fiancee Patient understands diagnosis: Yes. Discussing patient identified problems/goals with staff: Yes. Medical problems stabilized or resolved: Yes. Denies suicidal/homicidal ideation: Yes. Issues/concerns per patient self-inventory: No. Other: none  New problem(s) identified: No, Describe:  none  New Short Term/Long Term Goal(s): Pt goal is to "get stable" and arrange to enter residential substance abuse treatment.  Discharge Plan or Barriers: Residential substance abuse program.  Reason for Continuation of Hospitalization: Depression  Estimated Length of Stay: 2-4 days.  Attendees: Patient: Greg Moreno 05/29/2016   Physician: Dr. Ardyth Harps, MD 05/29/2016   Nursing: Alberteen Sam, RN 05/29/2016   RN Care Manager: 05/29/2016   Social Worker: Daleen Squibb, LCSW 05/29/2016   Recreational Therapist: Princella Ion, LRT/CTRS  05/29/2016   Other:  05/29/2016   Other:  05/29/2016   Other: 05/29/2016        Scribe for Treatment Team: Lorri Frederick, LCSW 05/29/2016 2:08 PM

## 2016-05-30 LAB — ANTINUCLEAR ANTIBODIES, IFA: ANTINUCLEAR ANTIBODIES, IFA: NEGATIVE

## 2016-05-30 MED ORDER — MELOXICAM 7.5 MG PO TABS: 7.5000 mg | ORAL_TABLET | Freq: Two times a day (BID) | ORAL | 0 refills | Status: AC

## 2016-05-30 NOTE — Progress Notes (Signed)
  [facility] Adult Case Management Discharge Plan :  Will you be returning to the same living situation after discharge:  Yes,  with fiancee At discharge, do you have transportation home?: Yes,  fiancee Do you have the ability to pay for your medications: No. Med management clinic referral made.  Release of information consent forms completed and in the chart;  Patient's signature needed at discharge.  Patient to Follow up at: Follow-up Information    [facility]. Go on 06/05/2016.   Why:  Please meet Unk Pinto on 06/05/16 at 7:15AM for your hospital follow up appointment.  Please bring a copy of your hospital discharge paperwork. Contact information: [address] [place] [address] [phone]        [facility]. Call.   Specialty:  General Practice Why:  stablish care with primary care Contact information: [address] [place] [address] [phone]           Next level of care provider has access to [facility] Link:no  Safety Planning and Suicide Prevention discussed: Yes,  with fiancee  Have you used any form of tobacco in the last 30 days? (Cigarettes, Smokeless Tobacco, Cigars, and/or Pipes): Yes  Has patient been referred to the Quitline?: Patient refused referral  Patient has been referred for addiction treatment: Yes  Lorri Frederick, LCSW 05/30/2016, 1:17 PM

## 2016-05-30 NOTE — Progress Notes (Signed)
Recreation Therapy Notes  INPATIENT RECREATION TR PLAN  Patient Details Name: Greg Moreno MRN: [id number] DOB: [birth year] Today's Date: 05/30/2016  Rec Therapy Plan Is patient appropriate for Therapeutic Recreation?: Yes Treatment times per week: At least once a week TR Treatment/Interventions: 1:1 session, Group participation (Comment) (Appropriate participation in daily recreational therapy tx)  Discharge Criteria Pt will be discharged from therapy if:: Treatment goals are met, Discharged Treatment plan/goals/alternatives discussed and agreed upon by:: Patient/family  Discharge Summary Short term goals set: See Care Plan Short term goals met: Not met Progress toward goals comments: Groups attended Which groups?: Self-esteem Reason goals not met: Patient d/c from hospital before goals could be met Reason patient discharged from therapy: Discharge from hospital Date patient discharged from therapy: 05/30/16   Leonette Monarch, LRT/CTRS 05/30/2016, 4:42 PM

## 2016-05-30 NOTE — [facility] Group Notes (Signed)
[facility] LCSW Group Therapy Note  Date/Time: 05/30/16, 0930  Type of Therapy/Topic:  Group Therapy:  Balance in Life  Participation Level:  active  Description of Group:    This group will address the concept of balance and how it feels and looks when one is unbalanced. Patients will be encouraged to process areas in their lives that are out of balance, and identify reasons for remaining unbalanced. Facilitators will guide patients utilizing problem- solving interventions to address and correct the stressor making their life unbalanced. Understanding and applying boundaries will be explored and addressed for obtaining  and maintaining a balanced life. Patients will be encouraged to explore ways to assertively make their unbalanced needs known to significant others in their lives, using other group members and facilitator for support and feedback.  Therapeutic Goals: 1. Patient will identify two or more emotions or situations they have that consume much of in their lives. 2. Patient will identify signs/triggers that life has become out of balance:  3. Patient will identify two ways to set boundaries in order to achieve balance in their lives:  4. Patient will demonstrate ability to communicate their needs through discussion and/or role plays  Summary of Patient Progress: Pt identified creative, work, and school as areas that are out of balance in his life and shared examples from his current situation with the group.            Therapeutic Modalities:   Cognitive Behavioral Therapy Solution-Focused Therapy Assertiveness Training  Daleen Squibb, [place]
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
## 2016-05-30 NOTE — Progress Notes (Signed)
Provided and reviewed discharge paperwork and prescriptions. Verified understanding by use of teach back method. Verbalizes understanding by use of teach back method. Denies SI/HI/AVH, pain. Belongings to be returned as noted on discharge. Pt's fiance to pick him up and transport home. Safety maintained with every 15 minute checks. Will continue to monitor.

## 2016-05-30 NOTE — [facility] Suicide Risk Assessment (Signed)
Greg Moreno Discharge Suicide Risk Assessment   Principal Problem: Depression Discharge Diagnoses:  Patient Active Problem List   Diagnosis Date Noted  . Unspecified depressive disorder [F32.9] 05/28/2016  . Cocaine use disorder, severe, dependence ([facility]) [F14.20] 05/28/2016  . Alcohol use disorder, severe, dependence ([facility]) [F10.20] 05/28/2016      Psychiatric Specialty Exam: ROS  Blood pressure (!) 124/95, pulse 83, temperature 98.3 F (36.8 C), temperature source Oral, resp. rate 20, height 6' (1.829 m), weight 119.3 kg (263 lb), SpO2 100 %.Body mass index is 35.67 kg/m.                                                       Mental Status Per Nursing Assessment::   On Admission:  Self-harm thoughts  Demographic Factors:  Male, Caucasian and Unemployed  Loss Factors: recent relapse  Historical Factors: Prior suicide attempts  Risk Reduction Factors:   Sense of responsibility to family, Living with another person, especially a relative, Positive social support and Positive coping skills or problem solving skills  Continued Clinical Symptoms:  Alcohol/Substance Abuse/Dependencies More than one psychiatric diagnosis Previous Psychiatric Diagnoses and Treatments  Cognitive Features That Contribute To Risk:  None    Suicide Risk:  Minimal: No identifiable suicidal ideation.  Patients presenting with no risk factors but with morbid ruminations; may be classified as minimal risk based on the severity of the depressive symptoms  Follow-up Information    [facility]. Go on 06/05/2016.   Why:  Please meet Unk Pinto on 06/05/16 at 7:15AM for your hospital follow up appointment.  Please bring a copy of your hospital discharge paperwork. Contact information: [address] [place] [address] [phone]              Jimmy Footman, MD 05/30/2016, 11:00 AM

## 2016-06-12 ENCOUNTER — Emergency Department
Admission: EM | Admit: 2016-06-12 | Discharge: 2016-06-13 | Disposition: A | Discharge status: Other Acute Inpatient Hospital | Payer: Self-pay | Attending: Student in an Organized Health Care Education/Training Program | Admitting: Student in an Organized Health Care Education/Training Program

## 2016-06-12 DIAGNOSIS — F1020 Alcohol dependence, uncomplicated: Secondary | ICD-10-CM | POA: Diagnosis present

## 2016-06-12 DIAGNOSIS — F332 Major depressive disorder, recurrent severe without psychotic features: Secondary | ICD-10-CM

## 2016-06-12 DIAGNOSIS — F17210 Nicotine dependence, cigarettes, uncomplicated: Secondary | ICD-10-CM | POA: Insufficient documentation

## 2016-06-12 DIAGNOSIS — R45851 Suicidal ideations: Secondary | ICD-10-CM

## 2016-06-12 DIAGNOSIS — F1910 Other psychoactive substance abuse, uncomplicated: Secondary | ICD-10-CM | POA: Insufficient documentation

## 2016-06-12 DIAGNOSIS — F1420 Cocaine dependence, uncomplicated: Secondary | ICD-10-CM | POA: Diagnosis present

## 2016-06-12 DIAGNOSIS — F17220 Nicotine dependence, chewing tobacco, uncomplicated: Secondary | ICD-10-CM | POA: Insufficient documentation

## 2016-06-12 DIAGNOSIS — F17290 Nicotine dependence, other tobacco product, uncomplicated: Secondary | ICD-10-CM | POA: Insufficient documentation

## 2016-06-12 DIAGNOSIS — Z79899 Other long term (current) drug therapy: Secondary | ICD-10-CM | POA: Insufficient documentation

## 2016-06-12 LAB — CBC WITH DIFFERENTIAL/PLATELET
Basophils Absolute: 0.2 10*3/uL — ABNORMAL HIGH (ref 0–0.1)
Basophils Relative: 1 %
EOS ABS: 0.1 10*3/uL (ref 0–0.7)
Eosinophils Relative: 0 %
HEMATOCRIT: 45.8 % (ref 40.0–52.0)
HEMOGLOBIN: 15.4 g/dL (ref 13.0–18.0)
LYMPHS ABS: 2.4 10*3/uL (ref 1.0–3.6)
Lymphocytes Relative: 16 %
MCH: 26.3 pg (ref 26.0–34.0)
MCHC: 33.7 g/dL (ref 32.0–36.0)
MCV: 78 fL — ABNORMAL LOW (ref 80.0–100.0)
MONOS PCT: 7 %
Monocytes Absolute: 1 10*3/uL (ref 0.2–1.0)
NEUTROS PCT: 76 %
Neutro Abs: 11.2 10*3/uL — ABNORMAL HIGH (ref 1.4–6.5)
Platelets: 288 10*3/uL (ref 150–440)
RBC: 5.87 MIL/uL (ref 4.40–5.90)
RDW: 15.1 % — ABNORMAL HIGH (ref 11.5–14.5)
WBC: 14.9 10*3/uL — ABNORMAL HIGH (ref 3.8–10.6)

## 2016-06-12 LAB — COMPREHENSIVE METABOLIC PANEL
ALK PHOS: 53 U/L (ref 38–126)
ALT: 15 U/L — ABNORMAL LOW (ref 17–63)
ANION GAP: 11 (ref 5–15)
AST: 17 U/L (ref 15–41)
Albumin: 3.7 g/dL (ref 3.5–5.0)
BILIRUBIN TOTAL: 0.5 mg/dL (ref 0.3–1.2)
BUN: 9 mg/dL (ref 6–20)
CALCIUM: 8.4 mg/dL — AB (ref 8.9–10.3)
CO2: 23 mmol/L (ref 22–32)
CREATININE: 0.76 mg/dL (ref 0.61–1.24)
Chloride: 102 mmol/L (ref 101–111)
GFR calc non Af Amer: >60 mL/min (ref >60)
Glucose, Bld: 99 mg/dL (ref 65–99)
Potassium: 3.5 mmol/L (ref 3.5–5.1)
Sodium: 136 mmol/L (ref 135–145)
Total Protein: 6.7 g/dL (ref 6.5–8.1)

## 2016-06-12 LAB — ETHANOL: ALCOHOL ETHYL (B): 29 mg/dL — AB (ref <5)

## 2016-06-12 NOTE — ED Provider Notes (Signed)
[facility] Emergency Department Provider Note    None    (approximate)  I have reviewed the triage vital signs and the nursing notes.   HISTORY  Chief Complaint Suicidal    HPI Greg Moreno is a 41 y.o. male with a history of polysubstance abuse as well as homelessness presents with suicidal ideation. Patient admits to abusing alcohol and cocaine today. States that he would plan to either overdose on alcohol and drugs or take a car and driving into a wall. Patient states is been struggling with hopelessness and these feelings for weeks. He's had similar symptoms in the past. Denies any fevers. Denies any reason for abusing alcohol or cocaine today. Denies any new stressors.   Past Medical History:  Diagnosis Date  . Polysubstance abuse    No family history on file. No past surgical history on file. Patient Active Problem List   Diagnosis Date Noted  . Unspecified depressive disorder 05/28/2016  . Cocaine use disorder, severe, dependence ([facility]) 05/28/2016  . Alcohol use disorder, severe, dependence ([facility]) 05/28/2016      Prior to Admission medications   Medication Sig Start Date End Date Taking? Authorizing Provider  meloxicam (MOBIC) 7.5 MG tablet Take 1 tablet (7.5 mg total) by mouth 2 (two) times daily. 05/30/16  Yes Jimmy Footman, MD    Allergies Patient has no known allergies.    Social History Social History  Substance Use Topics  . Smoking status: Current Every Day Smoker    Types: Cigarettes  . Smokeless tobacco: Current User    Types: Snuff, Chew  . Alcohol use Yes     Comment: 1/5 every day    Review of Systems Patient denies headaches, rhinorrhea, blurry vision, numbness, shortness of breath, chest pain, edema, cough, abdominal pain, nausea, vomiting, diarrhea, dysuria, fevers, rashes or hallucinations unless otherwise stated above in HPI. ____________________________________________   PHYSICAL EXAM:  VITAL  SIGNS: Vitals:   06/12/16 2055 06/12/16 2202  BP: (!) 157/98 135/85  Pulse: (!) 116 98  Resp: 20 18  Temp: 98.3 F (36.8 C) 99 F (37.2 C)    Constitutional: Alert and oriented. Well appearing and in no acute distress. Eyes: Conjunctivae are normal. PERRL. EOMI. Head: Atraumatic. Nose: No congestion/rhinnorhea. Mouth/Throat: Mucous membranes are moist.  Oropharynx non-erythematous. Neck: No stridor. Painless ROM. No cervical spine tenderness to palpation Hematological/Lymphatic/Immunilogical: No cervical lymphadenopathy. Cardiovascular: Normal rate, regular rhythm. Grossly normal heart sounds.  Good peripheral circulation. Respiratory: Normal respiratory effort.  No retractions. Lungs CTAB. Gastrointestinal: Soft and nontender. No distention. No abdominal bruits. No CVA tenderness. Genitourinary:  Musculoskeletal: No lower extremity tenderness nor edema.  No joint effusions. Neurologic:  Normal speech and language. No gross focal neurologic deficits are appreciated. No gait instability. Skin:  Skin is warm, dry and intact. No rash noted. Psychiatric: tearful, intoxicated ____________________________________________   LABS (all labs ordered are listed, but only abnormal results are displayed)  Results for orders placed or performed during the hospital encounter of 06/12/16 (from the past 24 hour(s))  CBC with Differential     Status: Abnormal   Collection Time: 06/12/16  9:02 PM  Result Value Ref Range   WBC 14.9 (H) 3.8 - 10.6 K/uL   RBC 5.87 4.40 - 5.90 MIL/uL   Hemoglobin 15.4 13.0 - 18.0 g/dL   HCT 95.2 84.1 - 32.4 %   MCV 78.0 (L) 80.0 - 100.0 fL   MCH 26.3 26.0 - 34.0 pg   MCHC 33.7 32.0 - 36.0  g/dL   RDW 16.1 (H) 09.6 - 04.5 %   Platelets 288 150 - 440 K/uL   Neutrophils Relative % 76 %   Neutro Abs 11.2 (H) 1.4 - 6.5 K/uL   Lymphocytes Relative 16 %   Lymphs Abs 2.4 1.0 - 3.6 K/uL   Monocytes Relative 7 %   Monocytes Absolute 1.0 0.2 - 1.0 K/uL   Eosinophils  Relative 0 %   Eosinophils Absolute 0.1 0 - 0.7 K/uL   Basophils Relative 1 %   Basophils Absolute 0.2 (H) 0 - 0.1 K/uL  Comprehensive metabolic panel     Status: Abnormal   Collection Time: 06/12/16  9:02 PM  Result Value Ref Range   Sodium 136 135 - 145 mmol/L   Potassium 3.5 3.5 - 5.1 mmol/L   Chloride 102 101 - 111 mmol/L   CO2 23 22 - 32 mmol/L   Glucose, Bld 99 65 - 99 mg/dL   BUN 9 6 - 20 mg/dL   Creatinine, Ser 4.09 0.61 - 1.24 mg/dL   Calcium 8.4 (L) 8.9 - 10.3 mg/dL   Total Protein 6.7 6.5 - 8.1 g/dL   Albumin 3.7 3.5 - 5.0 g/dL   AST 17 15 - 41 U/L   ALT 15 (L) 17 - 63 U/L   Alkaline Phosphatase 53 38 - 126 U/L   Total Bilirubin 0.5 0.3 - 1.2 mg/dL   GFR calc non Af Amer >60 >60 mL/min   GFR calc Af Amer >60 >60 mL/min   Anion gap 11 5 - 15  Ethanol     Status: Abnormal   Collection Time: 06/12/16  9:02 PM  Result Value Ref Range   Alcohol, Ethyl (B) 29 (H) <5 mg/dL   ____________________________________________ ____________________________________________  RADIOLOGY   ____________________________________________   PROCEDURES  Procedure(s) performed:  Procedures    Critical Care performed: no ____________________________________________   INITIAL IMPRESSION / ASSESSMENT AND PLAN / ED COURSE  Pertinent labs & imaging results that were available during my care of the patient were reviewed by me and considered in my medical decision making (see chart for details).  DDX: Psychosis, delirium, medication effect, noncompliance, polysubstance abuse, Si, Hi, depression   Greg Moreno is a 41 y.o. who presents to the ED with for evaluation of SI.  Patient has psych history of polysubstance abuse.  Laboratory testing was ordered to evaluation for underlying electrolyte derangement or signs of underlying organic pathology to explain today's presentation.  Based on history and physical and laboratory evaluation, it appears that the patient's presentation is 2/2  underlying psychiatric disorder and will require further evaluation and management by inpatient psychiatry.  Patient was  made an IVC due to SI and polysubstance abuse.  Disposition pending psychiatric evaluation.       ____________________________________________   FINAL CLINICAL IMPRESSION(S) / ED DIAGNOSES  Final diagnoses:  Suicidal ideation  Polysubstance abuse      NEW MEDICATIONS STARTED DURING THIS VISIT:  New Prescriptions   No medications on file     Note:  This document was prepared using Dragon voice recognition software and may include unintentional dictation errors.    Willy Eddy, MD 06/13/16 0000
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
## 2016-06-12 NOTE — ED Triage Notes (Signed)
Pt in with co feeling suicidal states has been abusing alcohol and cocaine. Hx of depression and suicide attempt in the past, pt calm and cooperative in triage.

## 2016-06-13 ENCOUNTER — Inpatient Hospital Stay
Admission: AD | Admit: 2016-06-13 | Discharge: 2016-06-16 | DRG: 885 | Disposition: A | Discharge status: Home / Self Care | Payer: No Typology Code available for payment source | Attending: Psychiatry | Admitting: Psychiatry

## 2016-06-13 ENCOUNTER — Emergency Department: Payer: Self-pay

## 2016-06-13 ENCOUNTER — Encounter: Payer: Self-pay | Admitting: Psychiatry

## 2016-06-13 DIAGNOSIS — F17200 Nicotine dependence, unspecified, uncomplicated: Secondary | ICD-10-CM | POA: Diagnosis present

## 2016-06-13 DIAGNOSIS — F1020 Alcohol dependence, uncomplicated: Secondary | ICD-10-CM | POA: Diagnosis present

## 2016-06-13 DIAGNOSIS — G4700 Insomnia, unspecified: Secondary | ICD-10-CM | POA: Diagnosis present

## 2016-06-13 DIAGNOSIS — F332 Major depressive disorder, recurrent severe without psychotic features: Secondary | ICD-10-CM

## 2016-06-13 DIAGNOSIS — K219 Gastro-esophageal reflux disease without esophagitis: Secondary | ICD-10-CM | POA: Diagnosis present

## 2016-06-13 DIAGNOSIS — Z590 Homelessness: Secondary | ICD-10-CM

## 2016-06-13 DIAGNOSIS — B349 Viral infection, unspecified: Secondary | ICD-10-CM | POA: Diagnosis present

## 2016-06-13 DIAGNOSIS — F1424 Cocaine dependence with cocaine-induced mood disorder: Secondary | ICD-10-CM | POA: Diagnosis present

## 2016-06-13 DIAGNOSIS — F1994 Other psychoactive substance use, unspecified with psychoactive substance-induced mood disorder: Secondary | ICD-10-CM | POA: Diagnosis present

## 2016-06-13 DIAGNOSIS — F1420 Cocaine dependence, uncomplicated: Secondary | ICD-10-CM | POA: Diagnosis present

## 2016-06-13 DIAGNOSIS — R45851 Suicidal ideations: Secondary | ICD-10-CM | POA: Diagnosis present

## 2016-06-13 LAB — URINALYSIS, COMPLETE (UACMP) WITH MICROSCOPIC
BACTERIA UA: NONE SEEN
Bilirubin Urine: NEGATIVE
Glucose, UA: NEGATIVE mg/dL
HGB URINE DIPSTICK: NEGATIVE
Ketones, ur: NEGATIVE mg/dL
Leukocytes, UA: NEGATIVE
NITRITE: NEGATIVE
PH: 6 (ref 5.0–8.0)
Protein, ur: NEGATIVE mg/dL
RBC / HPF: NONE SEEN RBC/hpf (ref 0–5)
SPECIFIC GRAVITY, URINE: 1.011 (ref 1.005–1.030)

## 2016-06-13 LAB — URINE DRUG SCREEN, QUALITATIVE (ARMC ONLY)
Amphetamines, Ur Screen: NOT DETECTED
Barbiturates, Ur Screen: NOT DETECTED
Benzodiazepine, Ur Scrn: NOT DETECTED
CANNABINOID 50 NG, UR ~~LOC~~: NOT DETECTED
COCAINE METABOLITE, UR ~~LOC~~: POSITIVE — AB
MDMA (ECSTASY) UR SCREEN: NOT DETECTED
METHADONE SCREEN, URINE: NOT DETECTED
OPIATE, UR SCREEN: NOT DETECTED
Phencyclidine (PCP) Ur S: NOT DETECTED
TRICYCLIC, UR SCREEN: NOT DETECTED

## 2016-06-13 MED ORDER — ALUM & MAG HYDROXIDE-SIMETH 200-200-20 MG/5ML PO SUSP: 30.0000 mL | ORAL | Status: DC | PRN

## 2016-06-13 MED ORDER — ALBUTEROL SULFATE HFA 108 (90 BASE) MCG/ACT IN AERS
2.0000 | INHALATION_SPRAY | RESPIRATORY_TRACT | Status: DC | PRN
  Filled 2016-06-13: qty 6.7

## 2016-06-13 MED ORDER — LORAZEPAM 1 MG PO TABS
1.0000 mg | ORAL_TABLET | Freq: Four times a day (QID) | ORAL | Status: DC | PRN
  Administered 2016-06-13: 1 mg via ORAL
  Filled 2016-06-13: qty 1

## 2016-06-13 MED ORDER — LORAZEPAM 1 MG PO TABS: 1.0000 mg | ORAL_TABLET | Freq: Four times a day (QID) | ORAL | Status: DC | PRN

## 2016-06-13 MED ORDER — LORAZEPAM 2 MG/ML IJ SOLN: 1.0000 mg | Freq: Four times a day (QID) | INTRAMUSCULAR | Status: DC | PRN

## 2016-06-13 MED ORDER — ADULT MULTIVITAMIN W/MINERALS CH
1.0000 | ORAL_TABLET | Freq: Every day | ORAL | Status: DC
  Administered 2016-06-13: 1 via ORAL
  Filled 2016-06-13: qty 1

## 2016-06-13 MED ORDER — MAGNESIUM HYDROXIDE 400 MG/5ML PO SUSP: 30.0000 mL | Freq: Every day | ORAL | Status: DC | PRN

## 2016-06-13 MED ORDER — VITAMIN B-1 100 MG PO TABS
100.0000 mg | ORAL_TABLET | Freq: Every day | ORAL | Status: DC
  Administered 2016-06-14 – 2016-06-16 (×3): 100 mg via ORAL
  Filled 2016-06-13 (×3): qty 1

## 2016-06-13 MED ORDER — CHLORDIAZEPOXIDE HCL 25 MG PO CAPS
25.0000 mg | ORAL_CAPSULE | Freq: Four times a day (QID) | ORAL | Status: DC
  Administered 2016-06-13 – 2016-06-16 (×11): 25 mg via ORAL
  Filled 2016-06-13 (×11): qty 1

## 2016-06-13 MED ORDER — VITAMIN B-1 100 MG PO TABS
100.0000 mg | ORAL_TABLET | Freq: Every day | ORAL | Status: DC
  Administered 2016-06-13: 100 mg via ORAL
  Filled 2016-06-13: qty 1

## 2016-06-13 MED ORDER — IPRATROPIUM-ALBUTEROL 0.5-2.5 (3) MG/3ML IN SOLN
3.0000 mL | Freq: Once | RESPIRATORY_TRACT | Status: AC
  Administered 2016-06-13: 3 mL via RESPIRATORY_TRACT
  Filled 2016-06-13: qty 3

## 2016-06-13 MED ORDER — IPRATROPIUM-ALBUTEROL 0.5-2.5 (3) MG/3ML IN SOLN
RESPIRATORY_TRACT | Status: AC
  Administered 2016-06-13: 10:00:00
  Filled 2016-06-13: qty 3

## 2016-06-13 MED ORDER — CITALOPRAM HYDROBROMIDE 20 MG PO TABS
20.0000 mg | ORAL_TABLET | Freq: Every day | ORAL | Status: DC
  Administered 2016-06-14 – 2016-06-16 (×3): 20 mg via ORAL
  Filled 2016-06-13 (×3): qty 1

## 2016-06-13 MED ORDER — CITALOPRAM HYDROBROMIDE 20 MG PO TABS
20.0000 mg | ORAL_TABLET | Freq: Every day | ORAL | Status: DC
  Administered 2016-06-13: 20 mg via ORAL
  Filled 2016-06-13: qty 1

## 2016-06-13 MED ORDER — ADULT MULTIVITAMIN W/MINERALS CH: 1.0000 | ORAL_TABLET | Freq: Every day | ORAL | Status: DC

## 2016-06-13 MED ORDER — LORAZEPAM 1 MG PO TABS
1.0000 mg | ORAL_TABLET | Freq: Once | ORAL | Status: AC
Start: 2016-06-13 — End: 2016-06-13
  Administered 2016-06-13: 1 mg via ORAL
  Filled 2016-06-13: qty 1

## 2016-06-13 MED ORDER — VITAMIN B-12 1000 MCG PO TABS
1000.0000 ug | ORAL_TABLET | Freq: Every day | ORAL | Status: DC
  Administered 2016-06-14: 1000 ug via ORAL
  Filled 2016-06-13: qty 1

## 2016-06-13 MED ORDER — TRAZODONE HCL 100 MG PO TABS
100.0000 mg | ORAL_TABLET | Freq: Every evening | ORAL | Status: DC | PRN
  Administered 2016-06-13 – 2016-06-15 (×3): 100 mg via ORAL
  Filled 2016-06-13 (×3): qty 1

## 2016-06-13 MED ORDER — NICOTINE 21 MG/24HR TD PT24
21.0000 mg | MEDICATED_PATCH | Freq: Once | TRANSDERMAL | Status: DC
  Administered 2016-06-13: 21 mg via TRANSDERMAL
  Filled 2016-06-13: qty 1

## 2016-06-13 MED ORDER — FOLIC ACID 1 MG PO TABS
1.0000 mg | ORAL_TABLET | Freq: Every day | ORAL | Status: DC
  Administered 2016-06-13: 1 mg via ORAL
  Filled 2016-06-13: qty 1

## 2016-06-13 MED ORDER — FOLIC ACID 1 MG PO TABS
1.0000 mg | ORAL_TABLET | Freq: Every day | ORAL | Status: DC
  Administered 2016-06-14 – 2016-06-16 (×3): 1 mg via ORAL
  Filled 2016-06-13 (×3): qty 1

## 2016-06-13 MED ORDER — THIAMINE HCL 100 MG/ML IJ SOLN: 100.0000 mg | Freq: Every day | INTRAMUSCULAR | Status: DC

## 2016-06-13 MED ORDER — ACETAMINOPHEN 325 MG PO TABS
650.0000 mg | ORAL_TABLET | Freq: Four times a day (QID) | ORAL | Status: DC | PRN
Start: 2016-06-13 — End: 2016-06-16
  Administered 2016-06-14 – 2016-06-15 (×2): 650 mg via ORAL
  Filled 2016-06-13 (×2): qty 2

## 2016-06-13 NOTE — ED Notes (Signed)
[facility] , Patient is being admitted to room 313.

## 2016-06-13 NOTE — Progress Notes (Signed)
D: Pt  Passive SI-contracts for safety denies HI/AVH. Pt is pleasant and cooperative. Pt was tired and a little groggy upon first interaction pt stated he "trying to sleep". Pt got up later in the evening and interacted with peers in the dayroom .   A: Pt was offered support and encouragement. Pt was given scheduled medications. Pt was encourage to attend groups. Q 15 minute checks were done for safety.   R:.Pt receptive to treatment and safety maintained on unit.

## 2016-06-13 NOTE — ED Provider Notes (Signed)
-----------------------------------------   7:13 AM on 06/13/2016 -----------------------------------------   Blood pressure 112/63, pulse 84, temperature 98.2 F (36.8 C), temperature source Oral, resp. rate 14, height 6' (1.829 m), weight 270 lb (122.5 kg), SpO2 93 %.  The patient had no acute events since last update.  Calm and cooperative at this time.  Disposition is pending Psychiatry/Behavioral Medicine team recommendations.     Jeanmarie PlantJames A McShane, MD 06/13/16 [phone]

## 2016-06-13 NOTE — Consult Note (Signed)
[facility] Psychiatry Consult   Reason for Consult:  Consult for 41 year old man who presented to the emergency room reporting suicidal thoughts and depression Referring Physician:  McShane Patient Identification: Nox Talent MRN:  [id number] Principal Diagnosis: Severe recurrent major depression without psychotic features [facility]) Diagnosis:   Patient Active Problem List   Diagnosis Date Noted  . Severe recurrent major depression without psychotic features ([place]) [F33.2] 06/13/2016  . Unspecified depressive disorder [F32.9] 05/28/2016  . Cocaine use disorder, severe, dependence ([place]) [F14.20] 05/28/2016  . Alcohol use disorder, severe, dependence ([place]) [F10.20] 05/28/2016    Total Time spent with patient: 1 hour  Subjective:   Larry Alcock is a 41 y.o. male patient admitted with "I'm not doing good".  HPI:  Patient interviewed. Chart reviewed. This is a 41 year old man who presented to the emergency room last night very depressed saying he was having suicidal thoughts. Patient tells me that his mood has been terrible very down for at least 1 or 2 weeks. Feels negative TIME.Marland Kitchen Poor self-esteem. Not taking care of himself. Sleeping very poorly at night. Not eating or drinking well. Patient has been using crack cocaine every day and drinking alcohol heavily. His fiance who had taken him back last time has thrown him out again so he is homeless again. He describes himself as homeless having lost his car and having lost "the love of my life". Patient says he is having thoughts of killing himself by throwing himself off a bridge or cliff. Not currently on any medication. Not having any psychosis.  Social history: Patient has not been able to get back to work since his last stay here in April. Continues to be out in the streets using drugs and drinking with minimal support.  Medical history: No significant ongoing medical problems. He presents with a heavy cough. ER physician looked at this  and got a chest x-ray. Chest x-ray unremarkable but the patient is being given some inhalers.  Substance abuse history: Patient reports he had had 2-1/2 years of sobriety in the past and only relapsed a couple months ago. Since then he has struggled to get himself sober again. No history of delirium tremens or seizures.  Past Psychiatric History: Positive history of one suicide attempt in the distant past. Previous history of hospitalizations and multiple antidepressants several years ago before he got sober. Patient was in the hospital here just a couple weeks ago and was discharged on no antidepressant medicine. No history of psychosis or mania.  Risk to Self: Suicidal Ideation: Yes-Currently Present Suicidal Intent: Yes-Currently Present Is patient at risk for suicide?: Yes Suicidal Plan?: Yes-Currently Present Specify Current Suicidal Plan: "To overdose on illicit drugs" Access to Means: Yes Specify Access to Suicidal Means: Ability to obtain illicit drugs What has been your use of drugs/alcohol within the last 12 months?: Cocaine and Alcohol How many times?: 1 Other Self Harm Risks: Active Addiction Triggers for Past Attempts: Other (Comment) (Active Addiction) Intentional Self Injurious Behavior: None Risk to Others: Homicidal Ideation: No Thoughts of Harm to Others: No Current Homicidal Intent: No Current Homicidal Plan: No Access to Homicidal Means: No Identified Victim: None Reported History of harm to others?: No Assessment of Violence: None Noted Violent Behavior Description: None Reported Does patient have access to weapons?: No Criminal Charges Pending?: No Does patient have a court date: No Prior Inpatient Therapy: Prior Inpatient Therapy: Yes Prior Therapy Dates: 05/2016 Prior Therapy Facilty/Provider(s): [facility] Reason for Treatment: Dual Dx (Depression & Substance Use) Prior Outpatient  Therapy: Prior Outpatient Therapy: No Does patient have an ACCT team?: No Does  patient have Intensive In-House Services?  : No Does patient have [facility] services? : No Does patient have P4CC services?: No  Past Medical History:  Past Medical History:  Diagnosis Date  . Polysubstance abuse    No past surgical history on file. Family History: No family history on file. Family Psychiatric  History: He denies knowing of any Social History:  History  Alcohol Use  . Yes    Comment: 1/5 every day     History  Drug Use  . Types: Cocaine    Comment: Pt states smokes crack cocaine    Social History   Social History  . Marital status: Single    Spouse name: N/A  . Number of children: N/A  . Years of education: N/A   Social History Main Topics  . Smoking status: Current Every Day Smoker    Types: Cigarettes  . Smokeless tobacco: Current User    Types: Snuff, Chew  . Alcohol use Yes     Comment: 1/5 every day  . Drug use: Yes    Types: Cocaine     Comment: Pt states smokes crack cocaine  . Sexual activity: Not on file   Other Topics Concern  . Not on file   Social History Narrative  . No narrative on file   Additional Social History:    Allergies:  No Known Allergies  Labs:  Results for orders placed or performed during the hospital encounter of 06/12/16 (from the past 48 hour(s))  CBC with Differential     Status: Abnormal   Collection Time: 06/12/16  9:02 PM  Result Value Ref Range   WBC 14.9 (H) 3.8 - 10.6 K/uL   RBC 5.87 4.40 - 5.90 MIL/uL   Hemoglobin 15.4 13.0 - 18.0 g/dL   HCT 45.8 40.0 - 52.0 %   MCV 78.0 (L) 80.0 - 100.0 fL   MCH 26.3 26.0 - 34.0 pg   MCHC 33.7 32.0 - 36.0 g/dL   RDW 15.1 (H) 11.5 - 14.5 %   Platelets 288 150 - 440 K/uL   Neutrophils Relative % 76 %   Neutro Abs 11.2 (H) 1.4 - 6.5 K/uL   Lymphocytes Relative 16 %   Lymphs Abs 2.4 1.0 - 3.6 K/uL   Monocytes Relative 7 %   Monocytes Absolute 1.0 0.2 - 1.0 K/uL   Eosinophils Relative 0 %   Eosinophils Absolute 0.1 0 - 0.7 K/uL   Basophils Relative 1 %    Basophils Absolute 0.2 (H) 0 - 0.1 K/uL  Comprehensive metabolic panel     Status: Abnormal   Collection Time: 06/12/16  9:02 PM  Result Value Ref Range   Sodium 136 135 - 145 mmol/L   Potassium 3.5 3.5 - 5.1 mmol/L   Chloride 102 101 - 111 mmol/L   CO2 23 22 - 32 mmol/L   Glucose, Bld 99 65 - 99 mg/dL   BUN 9 6 - 20 mg/dL   Creatinine, Ser 0.76 0.61 - 1.24 mg/dL   Calcium 8.4 (L) 8.9 - 10.3 mg/dL   Total Protein 6.7 6.5 - 8.1 g/dL   Albumin 3.7 3.5 - 5.0 g/dL   AST 17 15 - 41 U/L   ALT 15 (L) 17 - 63 U/L   Alkaline Phosphatase 53 38 - 126 U/L   Total Bilirubin 0.5 0.3 - 1.2 mg/dL   GFR calc non Af Amer >60 >60 mL/min  GFR calc Af Amer >60 >60 mL/min    Comment: (NOTE) The eGFR has been calculated using the CKD EPI equation. This calculation has not been validated in all clinical situations. eGFR's persistently <60 mL/min signify possible Chronic Kidney Disease.    Anion gap 11 5 - 15  Ethanol     Status: Abnormal   Collection Time: 06/12/16  9:02 PM  Result Value Ref Range   Alcohol, Ethyl (B) 29 (H) <5 mg/dL    Comment:        LOWEST DETECTABLE LIMIT FOR SERUM ALCOHOL IS 5 mg/dL FOR MEDICAL PURPOSES ONLY   Urinalysis, Complete w Microscopic     Status: Abnormal   Collection Time: 06/13/16  1:27 AM  Result Value Ref Range   Color, Urine YELLOW (A) YELLOW   APPearance HAZY (A) CLEAR   Specific Gravity, Urine 1.011 1.005 - 1.030   pH 6.0 5.0 - 8.0   Glucose, UA NEGATIVE NEGATIVE mg/dL   Hgb urine dipstick NEGATIVE NEGATIVE   Bilirubin Urine NEGATIVE NEGATIVE   Ketones, ur NEGATIVE NEGATIVE mg/dL   Protein, ur NEGATIVE NEGATIVE mg/dL   Nitrite NEGATIVE NEGATIVE   Leukocytes, UA NEGATIVE NEGATIVE   RBC / HPF NONE SEEN 0 - 5 RBC/hpf   WBC, UA 0-5 0 - 5 WBC/hpf   Bacteria, UA NONE SEEN NONE SEEN   Squamous Epithelial / LPF 0-5 (A) NONE SEEN   Mucous PRESENT   Urine Drug Screen, Qualitative ([facility] only)     Status: Abnormal   Collection Time: 06/13/16  1:27 AM    Result Value Ref Range   Tricyclic, Ur Screen NONE DETECTED NONE DETECTED   Amphetamines, Ur Screen NONE DETECTED NONE DETECTED   MDMA (Ecstasy)Ur Screen NONE DETECTED NONE DETECTED   Cocaine Metabolite,Ur [place] POSITIVE (A) NONE DETECTED   Opiate, Ur Screen NONE DETECTED NONE DETECTED   Phencyclidine (PCP) Ur S NONE DETECTED NONE DETECTED   Cannabinoid 50 Ng, Ur [place] NONE DETECTED NONE DETECTED   Barbiturates, Ur Screen NONE DETECTED NONE DETECTED   Benzodiazepine, Ur Scrn NONE DETECTED NONE DETECTED   Methadone Scn, Ur NONE DETECTED NONE DETECTED    Comment: (NOTE) 381  Tricyclics, urine               Cutoff 1000 ng/mL 200  Amphetamines, urine             Cutoff 1000 ng/mL 300  MDMA (Ecstasy), urine           Cutoff 500 ng/mL 400  Cocaine Metabolite, urine       Cutoff 300 ng/mL 500  Opiate, urine                   Cutoff 300 ng/mL 600  Phencyclidine (PCP), urine      Cutoff 25 ng/mL 700  Cannabinoid, urine              Cutoff 50 ng/mL 800  Barbiturates, urine             Cutoff 200 ng/mL 900  Benzodiazepine, urine           Cutoff 200 ng/mL 1000 Methadone, urine                Cutoff 300 ng/mL 1100 1200 The urine drug screen provides only a preliminary, unconfirmed 1300 analytical test result and should not be used for non-medical 1400 purposes. Clinical consideration and professional judgment should 1500 be applied to any positive drug screen result due to  possible 1600 interfering substances. A more specific alternate chemical method 1700 must be used in order to obtain a confirmed analytical result.  1800 Gas chromato graphy / mass spectrometry (GC/MS) is the preferred 1900 confirmatory method.     Current Facility-Administered Medications  Medication Dose Route Frequency Provider Last Rate Last Dose  . nicotine (NICODERM CQ - dosed in mg/24 hours) patch 21 mg  21 mg Transdermal Once Jeanmarie Plant, MD   21 mg at 06/13/16 [id number]   Current Outpatient Prescriptions  Medication  Sig Dispense Refill  . meloxicam (MOBIC) 7.5 MG tablet Take 1 tablet (7.5 mg total) by mouth 2 (two) times daily. 60 tablet 0    Musculoskeletal: Strength & Muscle Tone: within normal limits Gait & Station: normal Patient leans: N/A  Psychiatric Specialty Exam: Physical Exam  Nursing note and vitals reviewed. Constitutional: He appears well-developed and well-nourished.  HENT:  Head: Normocephalic and atraumatic.  Eyes: Conjunctivae are normal. Pupils are equal, round, and reactive to light.  Neck: Normal range of motion.  Cardiovascular: Regular rhythm and normal heart sounds.   Respiratory: Effort normal.      GI: Soft.  Musculoskeletal: Normal range of motion.  Neurological: He is alert.  Skin: Skin is warm and dry.  Psychiatric: His affect is blunt. His speech is delayed. He is slowed and withdrawn. He expresses impulsivity. He exhibits a depressed mood. He expresses suicidal ideation. He expresses suicidal plans. He exhibits abnormal recent memory.    Review of Systems  Constitutional: Negative.   HENT: Negative.   Eyes: Negative.   Respiratory: Positive for cough.   Cardiovascular: Negative.   Gastrointestinal: Negative.   Musculoskeletal: Negative.   Skin: Negative.   Neurological: Negative.   Psychiatric/Behavioral: Positive for depression, memory loss, substance abuse and suicidal ideas. Negative for hallucinations. The patient is nervous/anxious and has insomnia.     Blood pressure 112/63, pulse 84, temperature 98.2 F (36.8 C), temperature source Oral, resp. rate 14, height 6' (1.829 m), weight 122.5 kg (270 lb), SpO2 93 %.Body mass index is 36.62 kg/m.  General Appearance: Disheveled  Eye Contact:  Minimal  Speech:  Slow  Volume:  Decreased  Mood:  Depressed  Affect:  Depressed  Thought Process:  Coherent  Orientation:  Full (Time, Place, and Person)  Thought Content:  Rumination  Suicidal Thoughts:  Yes.  with intent/plan  Homicidal Thoughts:  No    Memory:  Immediate;   Fair Recent;   Poor Remote;   Fair  Judgement:  Impaired  Insight:  Shallow  Psychomotor Activity:  Decreased  Concentration:  Concentration: Poor  Recall:  [employer] of Knowledge:  Fair  Language:  Fair  Akathisia:  No  Handed:  Right  AIMS (if indicated):     Assets:  Desire for Improvement Physical Health Resilience  ADL's:  Intact  Cognition:  Impaired,  Mild  Sleep:        Treatment Plan Summary: Daily contact with patient to assess and evaluate symptoms and progress in treatment, Medication management and Plan Patient presents to the hospital once again with multiple symptoms of major depression including active suicidal thoughts. Alcohol level was 29 when he presented. He said his last drink had been a couple hours previously. Drug screen positive for cocaine. Patient is withdrawn and sleepy but responsive right now. Medically stable other than for cough. Due to suicidality he will be admitted again to the psychiatric ward. Continue current medicine restart antidepressants. When necessary detox medicine. Supportive  and individual counseling.  Disposition: Recommend psychiatric Inpatient admission when medically cleared. Supportive therapy provided about ongoing stressors.  Alethia Berthold, MD 06/13/2016 12:22 PM

## 2016-06-13 NOTE — Plan of Care (Signed)
Problem: Education: Goal: Knowledge of [facility] Education information/materials will improve Outcome: Progressing Review  Of education process,  receptive

## 2016-06-13 NOTE — ED Notes (Signed)
[facility] all belongings given to Patient and transferred via police escort.

## 2016-06-13 NOTE — ED Notes (Signed)
Patient complained of having anxiety and also craving cigerette, Patient also with wheezy coarse cough noted, patient states that he has had cough for 3 weeks,  nurse called Ed Doctor and received new orders.

## 2016-06-13 NOTE — ED Notes (Signed)
Patient's breakfast tray given to Patient, he ate 100 % of breakfast, patient quiet and did not want to engage in conversation , nurse told him she could come back and talk with him soon, Patient is safe, q 15 minute checks and camera surveillance in progress.

## 2016-06-13 NOTE — ED Notes (Signed)
Patient went for chest x-ray with police and nurse escort.

## 2016-06-13 NOTE — ED Notes (Signed)
[facility] by Dr. Toni Amendlapacs.  Attending Physician will be Dr. Jennet MaduroPucilowska.   Patient has been assigned to room 313, [facility] Gwen.   Intake Paper Work has been signed and placed on patient chart.  ER staff is aware of the admission Greg Moreno( [facility].; ER MD; Toniann FailWendy Patient's Nurse & Dedra SkeensGwen Patient Access).

## 2016-06-13 NOTE — ED Notes (Addendum)
Patient's lunch served, Patient ate 100% of his lunch with beverage. Patient is safe, no complaints at this time, q 15 minute checks and camera surveillance in progress.

## 2016-06-13 NOTE — Tx Team (Signed)
Initial Treatment Plan 06/13/2016 4:21 PM Greg Moreno [place] WUJ:[id number]    PATIENT STRESSORS: Financial difficulties Loss of support Medication change or noncompliance Substance abuse   PATIENT STRENGTHS: Ability for insight Capable of independent living Communication skills   PATIENT IDENTIFIED PROBLEMS: Suicidal 06/13/16  Depression  06/13/16   Substance Abuse  06/13/16                 DISCHARGE CRITERIA:  Ability to meet basic life and health needs Improved stabilization in mood, thinking, and/or behavior  PRELIMINARY DISCHARGE PLAN: Outpatient therapy Return to previous living arrangement  PATIENT/FAMILY INVOLVEMENT: This treatment plan has been presented to and reviewed with the patient, Greg Moreno Fillinger, and/or family member,  .  The patient and family have been given the opportunity to ask questions and make suggestions.  Crist InfanteGwen A Nakiyah Beverley, RN 06/13/2016, 4:21 PM

## 2016-06-13 NOTE — ED Notes (Signed)
Patient is alert and oriented, denies Hi or avh, states he still has SI but no plan, patient with q 15 minute checks and camera surveillance in progress.

## 2016-06-13 NOTE — ED Notes (Signed)
Patient with flat, depressed affect, states that he is still having suicidal ideations, but has contracted for safety while here in hospital, patient states that he has dealt with depression for many years and has drink and did drugs to help with his depression and anxiety, nurse ask him if he had taken antidepressants and he states " No, I don't want to take any medicine" Nurse encouraged him to try medication vs self medicating with drugs and that He would start to improve over time, Patient denies hearing voices, no HI, will continue to monitor, and camera surveillance in progress.

## 2016-06-13 NOTE — Progress Notes (Signed)
Admission Note: Report from Nadeen LandauWendy Lancaster RN  [facility]  D: Pt appeared depressed  With  a flat affect.  Pt  denies / AVH at this time.   Patient voice of suicidal ideation  In  the ER but denied them here. Stated he has been abusing Cocaine and Alcohol  Drinking daily  Stated he is homeless . He and girlfriend  Broke up and now he has no where to live . Stated he has lost his job  Patient was  recently on the Adult Behavioral Unit  In April . Patient did no follow up and did not comply with medication Pt is redirectable and cooperative with assessment .      A: Pt admitted to unit per protocol, skin assessment and search done and no contraband found.  Pt  educated on therapeutic milieu rules. Pt was introduced to milieu by nursing staff.    R: Pt was receptive to education about the milieu .  15 min safety checks started. Clinical research associatewriter offered support

## 2016-06-13 NOTE — ED Notes (Signed)
Pt is alert and oriented on admission. Pt mood is depressed and affect is sad. Pt endorses passive SI but also contracts for safety. Writer [facility], provided snack and drink. Pt went to sleep soon after arrival. 15 minute checks are ongoing for safety.

## 2016-06-14 DIAGNOSIS — F332 Major depressive disorder, recurrent severe without psychotic features: Principal | ICD-10-CM

## 2016-06-14 MED ORDER — AZITHROMYCIN 250 MG PO TABS
500.0000 mg | ORAL_TABLET | Freq: Every day | ORAL | Status: AC
  Administered 2016-06-14: 500 mg via ORAL
  Filled 2016-06-14: qty 2

## 2016-06-14 MED ORDER — AZITHROMYCIN 250 MG PO TABS
250.0000 mg | ORAL_TABLET | Freq: Every day | ORAL | Status: DC
  Administered 2016-06-15 – 2016-06-16 (×2): 250 mg via ORAL
  Filled 2016-06-14 (×2): qty 1

## 2016-06-14 MED ORDER — ONDANSETRON HCL 4 MG PO TABS
4.0000 mg | ORAL_TABLET | Freq: Three times a day (TID) | ORAL | Status: DC | PRN
  Administered 2016-06-15: 4 mg via ORAL
  Filled 2016-06-14: qty 1

## 2016-06-14 MED ORDER — AZITHROMYCIN 250 MG PO TABS: ORAL_TABLET | ORAL | 0 refills | Status: AC

## 2016-06-14 MED ORDER — DIPHENHYDRAMINE HCL 25 MG PO CAPS
25.0000 mg | ORAL_CAPSULE | Freq: Four times a day (QID) | ORAL | Status: DC | PRN
  Administered 2016-06-15: 25 mg via ORAL
  Filled 2016-06-14: qty 1

## 2016-06-14 MED ORDER — BENZONATATE 100 MG PO CAPS
100.0000 mg | ORAL_CAPSULE | Freq: Three times a day (TID) | ORAL | Status: DC
  Administered 2016-06-14 – 2016-06-16 (×6): 100 mg via ORAL
  Filled 2016-06-14 (×6): qty 1

## 2016-06-14 MED ORDER — PANTOPRAZOLE SODIUM 40 MG PO TBEC
40.0000 mg | DELAYED_RELEASE_TABLET | Freq: Every day | ORAL | Status: DC
  Administered 2016-06-14 – 2016-06-16 (×3): 40 mg via ORAL
  Filled 2016-06-14 (×3): qty 1
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
## 2016-06-14 NOTE — Progress Notes (Signed)
Denies SI/HI/AVH. C/o of headache he rated 5/10. Tylenol given was effective. Medication compliant. Isolated to room during shift resting in bed. No complications from withdrawal noted. Pt encouraged to rest and drink fluids. Voices no additional concerns at this time. Safety maintained. Will continue to monitor.

## 2016-06-14 NOTE — [facility] Group Notes (Signed)
[facility] LCSW Group Therapy  06/14/2016 11:22 AM  Type of Therapy:  Group Therapy  Participation Level:  Patient did not attend group. CSW invited patient to group.   Summary of Progress/Problems: Feelings around Relapse. Group members discussed the meaning of relapse and shared personal stories of relapse, how it affected them and others, and how they perceived themselves during this time. Group members were encouraged to identify triggers, warning signs and coping skills used when facing the possibility of relapse. Social supports were discussed and explored in detail. Patients also discussed facing disappointment and how that can trigger someone to relapse.  Wilhelmena Zea G. Garnette CzechSampson MSW, LCSWA 06/14/2016, 11:23 AM

## 2016-06-14 NOTE — [facility] Suicide Risk Assessment (Signed)
[facility]   Nursing information obtained from:    Demographic factors:    Current Mental Status:    Loss Factors:    Historical Factors:    Risk Reduction Factors:     Total Time spent with patient: 1 hour Principal Problem: Severe recurrent major depression without psychotic features [facility](HCC) Diagnosis:   Patient Active Problem List   Diagnosis Date Noted  . 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 Tobacco use disorder [F17.200] 06/13/2016  . Substance induced mood disorder (HCC) [F19.94] 06/13/2016  . Cocaine use disorder, severe, dependence (HCC) [F14.20] 05/28/2016  . Alcohol use disorder, severe, dependence (HCC) [F10.20] 05/28/2016   Subjective Data: Suicidal ideation.  Continued Clinical Symptoms:  Alcohol Use Disorder Identification Test Final Score (AUDIT): 23 The "Alcohol Use Disorders Identification Test", Guidelines for Use in Primary Care, Second Edition.  World Science writerHealth Organization [facility](WHO). Score between 0-7:  no or low risk or alcohol related problems. Score between 8-15:  moderate risk of alcohol related problems. Score between 16-19:  high risk of alcohol related problems. Score 20 or above:  warrants further diagnostic evaluation for alcohol dependence and treatment.   CLINICAL FACTORS:   Depression:   Comorbid alcohol abuse/dependence Impulsivity Alcohol/Substance Abuse/Dependencies   Musculoskeletal: Strength & Muscle Tone: within normal limits Gait & Station: normal Patient leans: N/A  Psychiatric Specialty Exam: Physical Exam  Nursing note and vitals reviewed. Psychiatric: His affect is blunt. His speech is delayed. He is slowed and withdrawn. Cognition and memory are normal. He expresses impulsivity. He exhibits a depressed mood. He expresses suicidal ideation. He expresses suicidal plans.    Review of Systems  Psychiatric/Behavioral: Positive for depression, substance abuse  and suicidal ideas.  All other systems reviewed and are negative.   Blood pressure (!) 142/85, pulse 75, temperature 98.2 F (36.8 C), temperature source Oral, resp. rate 18, height 6' (1.829 m), weight 118.8 kg (262 lb), SpO2 99 %.Body mass index is 35.53 kg/m.  General Appearance: Casual  Eye Contact:  Minimal  Speech:  Slow  Volume:  Decreased  Mood:  Depressed and Hopeless  Affect:  Blunt  Thought Process:  Goal Directed and Descriptions of Associations: Intact  Orientation:  Full (Time, Place, and Person)  Thought Content:  WDL  Suicidal Thoughts:  Yes.  with intent/plan  Homicidal Thoughts:  No  Memory:  Immediate;   Fair Recent;   Fair Remote;   Fair  Judgement:  Poor  Insight:  Lacking  Psychomotor Activity:  Psychomotor Retardation  Concentration:  Concentration: Fair and Attention Span: Fair  Recall:  [employer]  Fund of Knowledge:  Fair  Language:  Fair  Akathisia:  No  Handed:  Right  AIMS (if indicated):     Assets:  Communication Skills Desire for Improvement Physical Health Resilience Social Support  ADL's:  Intact  Cognition:  WNL  Sleep:  Number of Hours: 6.45      COGNITIVE FEATURES THAT CONTRIBUTE TO RISK:  None    SUICIDE RISK:   Moderate:  Frequent suicidal ideation with limited intensity, and duration, some specificity in terms of plans, no associated intent, good self-control, limited dysphoria/symptomatology, some risk factors present, and identifiable protective factors, including available and accessible social support.  PLAN OF CARE: Hospital admission, medication management, absence abuse counseling, discharge planning.  Mr. Glendell DockerCooke is a 41 year old male with history of substance abuse and mood instability admitted for suicidal threats with a plan to jump off the overpass  in the context of relapse on substances.  1. Suicidal ideation. The patient is able to contract for safety in the hospital.  2. Mood. He was started on Celexa in the  ER.  3. Alcoholism. We will offer Librium taper.  4. Substance abuse treatment. The patient is interested in residential long-term program in [place]. Reportedly he may contact with them. Social worker to follow up.  5. Insomnia. Trazodone is available.  6. Smoking. Nicotine patch is available.  7. Disposition. To be established. The patient is homeless.   I certify that inpatient services furnished can reasonably be expected to improve the patient's condition.   Kristine Linea, MD 06/14/2016, 1:53 PM

## 2016-06-14 NOTE — [facility] Suicide Risk Assessment (Signed)
[facility] INPATIENT:  Family/Significant Other Suicide Prevention Education  Suicide Prevention Education:  Contact Attempts: Council MechanicJay Doss, friend, [phone], has been identified by the patient as the family member/significant other with whom the patient will be residing, and identified as the person(s) who will aid the patient in the event of a mental health crisis.  With written consent from the patient, two attempts were made to provide suicide prevention education, prior to and/or following the patient's discharge.  We were unsuccessful in providing suicide prevention education.  A suicide education pamphlet was given to the patient to share with family/significant other.  Date and time of first attempt:06/14/16, 1515 Date and time of second attempt:  Lorri FrederickWierda, Madilynn Montante Jon, LCSW 06/14/2016, 3:16 PM
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
## 2016-06-14 NOTE — Progress Notes (Signed)
Recreation Therapy Notes  At approximately 2:45 pm, LRT attempted assessment. Patient stated he felt sick and requested for LRT to come back Monday to complete assessment.  Jacquelynn CreeGreene,My Rinke M, LRT/CTRS 06/14/2016 3:10 PM

## 2016-06-14 NOTE — [facility] Suicide Risk Assessment (Signed)
[facility] INPATIENT:  Family/Significant Other Suicide Prevention Education  Suicide Prevention Education:  Education Completed; Greg Moreno, friend, [phone], has been identified by the patient as the family member/significant other with whom the patient will be residing, and identified as the person(s) who will aid the patient in the event of a mental health crisis (suicidal ideations/suicide attempt).  With written consent from the patient, the family member/significant other has been provided the following suicide prevention education, prior to the and/or following the discharge of the patient.  The suicide prevention education provided includes the following:  Suicide risk factors  Suicide prevention and interventions  National Suicide Hotline telephone number  [facility] assessment telephone number  [facility] Emergency Assistance 911  [facility] and/or Residential Mobile Crisis Unit telephone number  Request made of family/significant other to:  Remove weapons (e.g., guns, rifles, knives), all items previously/currently identified as safety concern.    Remove drugs/medications (over-the-counter, prescriptions, illicit drugs), all items previously/currently identified as a safety concern.  The family member/significant other verbalizes understanding of the suicide prevention education information provided.  The family member/significant other agrees to remove the items of safety concern listed above.  Greg Moreno [place]. He is going to have pt stay with him Sunday night with plan for him to fly out Monday, as requested by the program.  Greg Moreno, Greg Fernando Jon, LCSW 06/14/2016, 4:08 PM

## 2016-06-14 NOTE — Progress Notes (Signed)
First am patient reports that not feeling.  States "I threw up from coughing so much. "  Face flushed, sweaty at times.  Assessed per CIWA protocol.  Gatorade encouraged for hydration and assist with detox.  Scheduled medications given.  Denies SI/HI/AVH.  Safety checks maintained.  Support offered.

## 2016-06-14 NOTE — Tx Team (Signed)
Interdisciplinary Treatment and Diagnostic Plan Update  06/14/2016 Time of Session: 1119 Greg Moreno MRN: [id number]  Principal Diagnosis: Severe recurrent major depression without psychotic features [facility])  Secondary Diagnoses: Principal Problem:   Severe recurrent major depression without psychotic features (Greg Moreno) Active Problems:   Cocaine use disorder, severe, dependence ([name])   Alcohol use disorder, severe, dependence ([name])   Tobacco use disorder   Substance induced mood disorder ([name])   Current Medications:  Current Facility-Administered Medications  Medication Dose Route Frequency Provider Last Rate Last Dose  . acetaminophen (TYLENOL) tablet 650 mg  650 mg Oral Q6H PRN [name], MD      . albuterol (PROVENTIL HFA;VENTOLIN HFA) 108 (90 Base) MCG/ACT inhaler 2 puff  2 puff Inhalation Q4H PRN [name], MD      . alum & mag hydroxide-simeth (MAALOX/MYLANTA) 200-200-20 MG/5ML suspension 30 mL  30 mL Oral Q4H PRN [name], MD      . chlordiazePOXIDE (LIBRIUM) capsule 25 mg  25 mg Oral QID [name], MD   25 mg at 06/14/16 0900  . citalopram (CELEXA) tablet 20 mg  20 mg Oral Daily [name], MD   20 mg at 06/14/16 0900  . folic acid (FOLVITE) tablet 1 mg  1 mg Oral Daily [name], MD   1 mg at 06/14/16 0800  . magnesium hydroxide (MILK OF MAGNESIA) suspension 30 mL  30 mL Oral Daily PRN [name], MD      . thiamine (VITAMIN B-1) tablet 100 mg  100 mg Oral Daily [name], MD   100 mg at 06/14/16 0900   Or  . thiamine (B-1) injection 100 mg  100 mg Intravenous Daily [name], MD      . traZODone (DESYREL) tablet 100 mg  100 mg Oral QHS PRN [name], MD   100 mg at 06/13/16 2120  . vitamin B-12 (CYANOCOBALAMIN) tablet 1,000 mcg  1,000 mcg Oral Daily [name], MD   1,000 mcg at 06/14/16 0900   PTA Medications: Prescriptions Prior to Admission  Medication Sig Dispense Refill Last Dose  . meloxicam (MOBIC) 7.5 MG  tablet Take 1 tablet (7.5 mg total) by mouth 2 (two) times daily. 60 tablet 0     Patient Stressors: Financial difficulties Loss of support Medication change or noncompliance Substance abuse  Patient Strengths: Ability for insight Capable of independent living Communication skills  Treatment Modalities: Medication Management, Group therapy, Case management,  1 to 1 session with clinician, Psychoeducation, Recreational therapy.   Physician Treatment Plan for Primary Diagnosis: Severe recurrent major depression without psychotic features ([place]) Long Term Goal(s): Improvement in symptoms so as ready for discharge Improvement in symptoms so as ready for discharge   Short Term Goals: Ability to identify changes in lifestyle to reduce recurrence of condition will improve Ability to verbalize feelings will improve Ability to disclose and discuss suicidal ideas Ability to demonstrate self-control will improve Ability to identify and develop effective coping behaviors will improve Compliance with prescribed medications will improve Ability to identify triggers associated with substance abuse/mental health issues will improve Ability to identify changes in lifestyle to reduce recurrence of condition will improve Ability to demonstrate self-control will improve Ability to identify triggers associated with substance abuse/mental health issues will improve  Medication Management: Evaluate patient's response, side effects, and tolerance of medication regimen.  Therapeutic Interventions: 1 to 1 sessions, Unit Group sessions and Medication administration.  Evaluation of Outcomes:  Not Met  Physician Treatment Plan for Secondary Diagnosis: Principal Problem:   Severe recurrent major depression without psychotic features ([place]) Active Problems:   Cocaine use disorder, severe, dependence (HCC)   Alcohol use disorder, severe, dependence ([place])   Tobacco use disorder   Substance induced mood  disorder ([place])  Long Term Goal(s): Improvement in symptoms so as ready for discharge Improvement in symptoms so as ready for discharge   Short Term Goals: Ability to identify changes in lifestyle to reduce recurrence of condition will improve Ability to verbalize feelings will improve Ability to disclose and discuss suicidal ideas Ability to demonstrate self-control will improve Ability to identify and develop effective coping behaviors will improve Compliance with prescribed medications will improve Ability to identify triggers associated with substance abuse/mental health issues will improve Ability to identify changes in lifestyle to reduce recurrence of condition will improve Ability to demonstrate self-control will improve Ability to identify triggers associated with substance abuse/mental health issues will improve     Medication Management: Evaluate patient's response, side effects, and tolerance of medication regimen.  Therapeutic Interventions: 1 to 1 sessions, Unit Group sessions and Medication administration.  Evaluation of Outcomes: Not Met   RN Treatment Plan for Primary Diagnosis: Severe recurrent major depression without psychotic features ([place]) Long Term Goal(s): Knowledge of disease and therapeutic regimen to maintain health will improve  Short Term Goals: Ability to remain free from injury will improve and Ability to identify and develop effective coping behaviors will improve  Medication Management: RN will administer medications as ordered by provider, will assess and evaluate patient's response and provide education to patient for prescribed medication. RN will report any adverse and/or side effects to prescribing provider.  Therapeutic Interventions: 1 on 1 counseling sessions, Psychoeducation, Medication administration, Evaluate responses to treatment, Monitor vital signs and CBGs as ordered, Perform/monitor CIWA, COWS, AIMS and Fall Risk screenings as ordered,  Perform wound care treatments as ordered.  Evaluation of Outcomes: Not Met   LCSW Treatment Plan for Primary Diagnosis: Severe recurrent major depression without psychotic features ([place]) Long Term Goal(s): Safe transition to appropriate next level of care at discharge, Engage patient in therapeutic group addressing interpersonal concerns.  Short Term Goals: Engage patient in aftercare planning with referrals and resources and Facilitate patient progression through stages of change regarding substance use diagnoses and concerns  Therapeutic Interventions: Assess for all discharge needs, 1 to 1 time with Social worker, Explore available resources and support systems, Assess for adequacy in community support network, Educate family and significant other(s) on suicide prevention, Complete Psychosocial Assessment, Interpersonal group therapy.  Evaluation of Outcomes: Not Met   Progress in Treatment: Attending groups: No. Participating in groups: No. Taking medication as prescribed: Yes. Toleration medication: Yes. Family/Significant other contact made: No, will contact:  when given permission Patient understands diagnosis: Yes. Discussing patient identified problems/goals with staff: Yes. Medical problems stabilized or resolved: Yes. Denies suicidal/homicidal ideation: Yes. Issues/concerns per patient self-inventory: No. Other: none  New problem(s) identified: No, Describe:  none  New Short Term/Long Term Goal(s): Pt goal is to obtain residential substance abuse treatment.  Discharge Plan or Barriers: CSW assessing for appropriate plan.  Reason for Continuation of Hospitalization: Depression Medication stabilization  Estimated Length of Stay: 5-7 days.  Attendees: Patient: Greg Moreno 06/14/2016  Physician: Dr. Bary Leriche, MD 06/14/2016   Nursing:  06/14/2016   RN Care Manager: 06/14/2016   Social Worker: Lurline Idol, Chambers 06/14/2016   Recreational Therapist: Drue Flirt,  LRT/CTRS  06/14/2016  Other:  06/14/2016   Other:  06/14/2016        Scribe for Treatment Team: Joanne Chars, LCSW 06/14/2016 11:54 AM

## 2016-06-14 NOTE — Progress Notes (Signed)
CSW spoke with Greg DatesKevin Moreno of [facility] in [place].  [phone].  Pt completed his phone interview.  Work with wood is a significant part of the program and pt is reporting he has arthritis.  Greg Moreno told him to think over the weekend if he is willing to do the work and if he is, they will move forward with the admission process, which includes meeting with the director.  [facility] been at this program and is supporting pt and he has indicated he will assist pt with transportation and housing temporarily if needed prior to him going to [place]. Greg NashGregory Greg Moreno, Greg Moreno, Greg Moreno 06/14/2016 2:11 PM

## 2016-06-14 NOTE — Progress Notes (Signed)
Recreation Therapy Notes  Date: 05.04.18 Time: 1:00 pm Location: Craft Room  Group Topic: Social Skills  Goal Area(s) Addresses:  Patient will effectively work with peer towards shared goal. Patient will identify skills used to make activity successful Patient will identify benefit of using group skills effectively post d/c.  Behavioral Response: Did not attend  Intervention: [employer]er Tower  Activity: Patients were split into groups and given 15 pipe cleaners. Patients were instructed to build a free standing tower. Patients were given 2 minutes to strategize. After building for about 3 minutes, patients were instructed to put their dominant hand behind their backs. After another 2 minutes of building, patients were instructed to stop talking to each other.  Education: LRT educated patients on healthy support systems.  Education Outcome: Patient did not attend group.   Clinical Observations/Feedback: Patient did not attend group.  Jacquelynn CreeGreene,Zakkery Dorian M, LRT/CTRS 06/14/2016 2:15 PM

## 2016-06-15 MED ORDER — NICOTINE 21 MG/24HR TD PT24
21.0000 mg | MEDICATED_PATCH | Freq: Every day | TRANSDERMAL | Status: DC
  Administered 2016-06-15 – 2016-06-16 (×2): 21 mg via TRANSDERMAL
  Filled 2016-06-15 (×2): qty 1

## 2016-06-15 MED ORDER — GUAIFENESIN-DM 100-10 MG/5ML PO SYRP
5.0000 mL | ORAL_SOLUTION | ORAL | Status: DC | PRN
  Administered 2016-06-15 – 2016-06-16 (×3): 5 mL via ORAL
  Filled 2016-06-15 (×3): qty 5

## 2016-06-15 NOTE — Plan of Care (Signed)
Problem: Coping: Goal: Ability to verbalize feelings will improve Outcome: Progressing Pt complains of being anxious with headache, cough and sweats today. Reports thoughts are racing, unable to control thoughts, but refuses although encouraged to come out of room into milieu, attend group.

## 2016-06-15 NOTE — [facility] Group Notes (Signed)
[facility] Group Notes:  (Nursing/MHT/Case Management/Adjunct)  Date:  06/15/2016  Time:  11:36 PM  Type of Therapy:  Group Therapy  Participation Level:  Active  Participation Quality:  Appropriate  Affect:  Appropriate  Cognitive:  Alert  Insight:  Appropriate  Engagement in Group:  Supportive  Modes of Intervention:  Support  Summary of Progress/Problems:  Mayra NeerJackie L Cyanna Neace 06/15/2016, 11:36 PM

## 2016-06-15 NOTE — Progress Notes (Signed)
Patient complains of headache, cough, sweats, nausea, and anxiety today. Isolative to room although encouraged to come out of room into dayroom and to attend group, pt refuses. Pt came to this nurse stating "I need something to help me, for my anxiety. My thoughts are just racing right now and I can't control it." Again encouraged pt to come out of his room, to not be isolative, but he continues to refuse. Pt appears depressed, flat, anxious. Body odor present, refuses ADLs. VS stable. Passive suicidal thoughts expressed, denies HI/AVH.  Support and encouragement provided. Medications administered as ordered with education. Safety maintained with every 15 minute checks. Will continue to monitor.

## 2016-06-15 NOTE — Progress Notes (Signed)
Pt requesting his driver's license number so that a plane ticket can be purchased in order for him to fly to [place] on Monday. Obtained number from pt's belongings with security present. Pt stating that he plans to discharge tomorrow (Sunday) in order to admit himself to one year program for drug/alcohol rehab in [place]. Pt questioning a time he might be discharged tomorrow, informed that would be up to doctor. SW not available at this time to review these plans. Will inform SW tomorrow.

## 2016-06-15 NOTE — Progress Notes (Signed)
Denies SI/HI/AVH. C/o of cough; prn given was effective.  Medication compliant. Minimal interaction with staff or peers.  No complications from withdrawal noted. Pt encouraged to rest and drink fluids. Voices no additional concerns at this time. Safety maintained. Will continue to monitor

## 2016-06-15 NOTE — Progress Notes (Signed)
[facility] MD Progress Note  06/15/2016 2:22 PM Greg Moreno  MRN:  [id number] Subjective:  "I'm not feeling good" this patient with depression and alcohol abuse and drug abuse was back in the hospital for stabilization. Tentative plan by primary psychiatrist had been for discharge tomorrow with follow-up at a long-term program in [place]. On interview today the patient reports his mood is still down and flat and somewhat depressed. Has passive suicidal thoughts. He is feeling quite sick today. Coughing a lot. Feels achy all over. Seems to probably have a viral syndrome. Only partially cooperative and interactive. Principal Problem: Severe recurrent major depression without psychotic features (HCC) Diagnosis:   Patient Active Problem List   Diagnosis Date Noted  . 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 Tobacco use disorder [F17.200] 06/13/2016  . Substance induced mood disorder (HCC) [F19.94] 06/13/2016  . Cocaine use disorder, severe, dependence (HCC) [F14.20] 05/28/2016  . Alcohol use disorder, severe, dependence (HCC) [F10.20] 05/28/2016   Total Time spent with patient: 30 minutes  Past Psychiatric History: Patient has a history of alcohol abuse and depression largely related to drug and alcohol abuse. History of diagnosis of depression history of suicidality.  Past Medical History:  Past Medical History:  Diagnosis Date  . Polysubstance abuse    History reviewed. No pertinent surgical history. Family History: History reviewed. No pertinent family history. Family Psychiatric  History: Positive for substance abuse Social History:  History  Alcohol Use  . Yes    Comment: 1/5 every day     History  Drug Use  . Types: Cocaine    Comment: Pt states smokes crack cocaine    Social History   Social History  . Marital status: Single    Spouse name: N/A  . Number of children: N/A  . Years of education: N/A   Social History Main Topics   . Smoking status: Current Every Day Smoker    Types: Cigarettes  . Smokeless tobacco: Current User    Types: Snuff, Chew  . Alcohol use Yes     Comment: 1/5 every day  . Drug use: Yes    Types: Cocaine     Comment: Pt states smokes crack cocaine  . Sexual activity: Not Asked   Other Topics Concern  . None   Social History Narrative  . None   Additional Social History:                         Sleep: Fair  Appetite:  Fair  Current Medications: Current Facility-Administered Medications  Medication Dose Route Frequency Provider Last Rate Last Dose  . acetaminophen (TYLENOL) tablet 650 mg  650 mg Oral Q6H PRN Katerin Negrete T, MD   650 mg at 06/15/16 1200  . albuterol (PROVENTIL HFA;VENTOLIN HFA) 108 (90 Base) MCG/ACT inhaler 2 puff  2 puff Inhalation Q4H PRN Adison Jerger T, MD      . alum & mag hydroxide-simeth (MAALOX/MYLANTA) 200-200-20 MG/5ML suspension 30 mL  30 mL Oral Q4H PRN Gabryella Murfin T, MD      . azithromycin (ZITHROMAX) tablet 250 mg  250 mg Oral Daily Pucilowska, Jolanta B, MD   250 mg at 06/15/16 0801  . benzonatate (TESSALON) capsule 100 mg  100 mg Oral TID Pucilowska, Jolanta B, MD   100 mg at 06/15/16 1200  . chlordiazePOXIDE (LIBRIUM) capsule 25 mg  25 mg Oral QID Pucilowska, Jolanta B, MD   25 mg at 06/15/16  1159  . citalopram (CELEXA) tablet 20 mg  20 mg Oral Daily Trequan Marsolek, Jackquline Denmark, MD   20 mg at 06/15/16 0801  . diphenhydrAMINE (BENADRYL) capsule 25 mg  25 mg Oral Q6H PRN Pucilowska, Jolanta B, MD      . folic acid (FOLVITE) tablet 1 mg  1 mg Oral Daily Dawana Asper, Jackquline Denmark, MD   1 mg at 06/15/16 0801  . guaiFENesin-dextromethorphan (ROBITUSSIN DM) 100-10 MG/5ML syrup 5 mL  5 mL Oral Q4H PRN Sara Selvidge T, MD      . magnesium hydroxide (MILK OF MAGNESIA) suspension 30 mL  30 mL Oral Daily PRN Pearce Littlefield T, MD      . nicotine (NICODERM CQ - dosed in mg/24 hours) patch 21 mg  21 mg Transdermal Daily Pucilowska, Jolanta B, MD   21 mg at 06/15/16 0803   . ondansetron (ZOFRAN) tablet 4 mg  4 mg Oral Q8H PRN Pucilowska, Jolanta B, MD   4 mg at 06/15/16 0801  . pantoprazole (PROTONIX) EC tablet 40 mg  40 mg Oral Daily Pucilowska, Jolanta B, MD   40 mg at 06/15/16 0801  . thiamine (VITAMIN B-1) tablet 100 mg  100 mg Oral Daily Serena Petterson T, MD   100 mg at 06/15/16 0801   Or  . thiamine (B-1) injection 100 mg  100 mg Intravenous Daily Obed Samek T, MD      . traZODone (DESYREL) tablet 100 mg  100 mg Oral QHS PRN Merla Sawka, Jackquline Denmark, MD   100 mg at 06/14/16 2114    Lab Results: No results found for this or any previous visit (from the past 48 hour(s)).  Blood Alcohol level:  Lab Results  Component Value Date   ETH 29 (H) 06/12/2016   ETH 16 (H) 05/26/2016    Metabolic Disorder Labs: Lab Results  Component Value Date   HGBA1C 5.5 05/28/2016   MPG 111 05/28/2016   No results found for: PROLACTIN Lab Results  Component Value Date   CHOL 137 05/28/2016   TRIG 97 05/28/2016   HDL 42 05/28/2016   CHOLHDL 3.3 05/28/2016   VLDL 19 05/28/2016   LDLCALC 76 05/28/2016    Physical Findings: AIMS:  , ,  ,  ,    CIWA:  CIWA-Ar Total: 9 COWS:     Musculoskeletal: Strength & Muscle Tone: within normal limits Gait & Station: unsteady Patient leans: N/A  Psychiatric Specialty Exam: Physical Exam  Nursing note and vitals reviewed. Constitutional: He appears well-developed and well-nourished.  HENT:  Head: Normocephalic and atraumatic.  Eyes: Conjunctivae are normal. Pupils are equal, round, and reactive to light.  Neck: Normal range of motion.  Cardiovascular: Regular rhythm and normal heart sounds.   Respiratory: He is in respiratory distress.  GI: Soft.  Musculoskeletal: Normal range of motion.  Neurological: He is alert.  Skin: Skin is warm and dry.  Psychiatric: Judgment normal. His affect is blunt. His speech is delayed. He is slowed. Cognition and memory are normal. He exhibits a depressed mood. He expresses suicidal  ideation. He expresses no suicidal plans.    Review of Systems  Constitutional: Negative.   HENT: Negative.   Eyes: Negative.   Respiratory: Positive for cough.   Cardiovascular: Negative.   Gastrointestinal: Negative.   Musculoskeletal: Positive for myalgias.  Skin: Negative.   Neurological: Negative.   Psychiatric/Behavioral: Positive for depression, substance abuse and suicidal ideas. Negative for hallucinations and memory loss. The patient has insomnia. The patient is not nervous/anxious.  Blood pressure 132/87, pulse 83, temperature 98.1 F (36.7 C), temperature source Oral, resp. rate 18, height 6' (1.829 m), weight 118.8 kg (262 lb), SpO2 99 %.Body mass index is 35.53 kg/m.  General Appearance: Casual  Eye Contact:  Minimal  Speech:  Slow  Volume:  Decreased  Mood:  Depressed  Affect:  Constricted  Thought Process:  Goal Directed  Orientation:  Full (Time, Place, and Person)  Thought Content:  Logical  Suicidal Thoughts:  Yes.  without intent/plan  Homicidal Thoughts:  No  Memory:  Immediate;   Fair Recent;   Fair Remote;   Fair  Judgement:  Fair  Insight:  Fair  Psychomotor Activity:  Decreased  Concentration:  Concentration: Fair  Recall:  [employer]Fair  Fund of Knowledge:  Fair  Language:  Fair  Akathisia:  No  Handed:  Right  AIMS (if indicated):     Assets:  Communication Skills Desire for Improvement Social Support  ADL's:  Intact  Cognition:  Impaired,  Mild  Sleep:  Number of Hours: 7.15     Treatment Plan Summary: Daily contact with patient to assess and evaluate symptoms and progress in treatment, Medication management and Plan Agent is still having some shakiness and nausea and subjective alcohol withdrawal. Mood is depressed. Passive suicidal thoughts but not acting out on it. He appears to be feeling much more sick in terms of a viral infection. Lots of coughing very withdrawn minimal in her activity. I did order some stronger cough medicine for him.  Case reviewed with nursing. Patient encouraged to try and be up and interactive. We may need to reevaluate tomorrow whether discharge remains appropriate if he is still feeling this bad.  Mordecai RasmussenJohn Christy Ehrsam, MD 06/15/2016, 2:22 PM

## 2016-06-15 NOTE — [facility] Group Notes (Signed)
[facility] LCSW Group Therapy  06/15/2016 2:15 PM  Type of Therapy:  Group Therapy  Participation Level:  Patient did not attend group. CSW invited patient to group.   Summary of Progress/Problems: Communications: Patients identify how individuals communicate with one another appropriately and inappropriately. Patients will be guided to discuss their thoughts, feelings, and behaviors related to barriers when communicating. The group will process together ways to execute positive and appropriate communications.   Anjana Cheek G. Garnette CzechSampson MSW, LCSWA 06/15/2016, 2:15 PM

## 2016-06-16 NOTE — Progress Notes (Signed)
  [facility] :  Will you be returning to the same living situation after discharge:  No. At discharge, do you have transportation home?: Yes,  friend of patient Do you have the ability to pay for your medications: No.  Release of information consent forms completed and in the chart;  Patient's signature needed at discharge.  Patient to Follow up at: Follow-up Information    [facility] Go on 06/17/2016.   Why:  [facility] program on this date for long term substance and alcohol use treatment. Contact for this program is Michaell CowingKevin Tompkins [facility]: [phone].  Contact information: [address] [place] [id number] [phone]       [facility] Go in 7 day(s).   Why:  Follow-up with [facility]  within 7 days of discharge for outpatient services including medication management and outpatient therapy in the event you [facility].  Contact information: [address] [place] [id number] [phone]           Next level of care provider has access to [facility] Link:no  Safety Planning and Suicide Prevention discussed: Yes,  with patient and identified support person   Have you used any form of tobacco in the last 30 days? (Cigarettes, Smokeless Tobacco, Cigars, and/or Pipes): Yes  Has patient been referred to the Quitline?: Patient refused referral  Patient has been referred for addiction treatment: Yes  Letti Towell G. Garnette CzechSampson MSW, LCSWA 06/16/2016, 11:23 AM

## 2016-06-16 NOTE — [facility] Suicide Risk Assessment (Signed)
[facility] Discharge Suicide Risk Assessment   Principal Problem: Severe recurrent major depression without psychotic features [facility]) Discharge Diagnoses:  Patient Active Problem List   Diagnosis Date Noted  . 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 Tobacco use disorder [F17.200] 06/13/2016  . Substance induced mood disorder (HCC) [F19.94] 06/13/2016  . Cocaine use disorder, severe, dependence (HCC) [F14.20] 05/28/2016  . Alcohol use disorder, severe, dependence (HCC) [F10.20] 05/28/2016    Total Time spent with patient: 45 minutes  Musculoskeletal: Strength & Muscle Tone: within normal limits Gait & Station: normal Patient leans: N/A  Psychiatric Specialty Exam: Review of Systems  Constitutional: Negative.   HENT: Negative.   Eyes: Negative.   Respiratory: Positive for cough.   Cardiovascular: Negative.   Gastrointestinal: Negative.   Musculoskeletal: Negative.   Skin: Negative.   Neurological: Negative.   Psychiatric/Behavioral: Positive for depression and substance abuse. Negative for hallucinations, memory loss and suicidal ideas. The patient is nervous/anxious. The patient does not have insomnia.     Blood pressure 119/87, pulse 85, temperature 98.6 F (37 C), temperature source Oral, resp. rate 18, height 6' (1.829 m), weight 118.8 kg (262 lb), SpO2 99 %.Body mass index is 35.53 kg/m.  General Appearance: Casual  Eye Contact::  Good  Speech:  Clear and Coherent409  Volume:  Decreased  Mood:  Anxious  Affect:  Congruent  Thought Process:  Goal Directed  Orientation:  Full (Time, Place, and Person)  Thought Content:  Logical  Suicidal Thoughts:  No  Homicidal Thoughts:  No  Memory:  Immediate;   Good Recent;   Fair Remote;   Fair  Judgement:  Fair  Insight:  Fair  Psychomotor Activity:  Normal  Concentration:  Fair  Recall:  [employer] of Knowledge:Fair  Language: Fair  Akathisia:  No  Handed:  Right  AIMS (if  indicated):     Assets:  Communication Skills Desire for Improvement Physical Health Resilience Social Support  Sleep:  Number of Hours: 7.3  Cognition: WNL  ADL's:  Intact   Mental Status Per Nursing Assessment::   On Admission:     Demographic Factors:  Male, Divorced or widowed, Caucasian and Unemployed  Loss Factors: Loss of significant relationship  Historical Factors: Prior suicide attempts  Risk Reduction Factors:   Sense of responsibility to family, Religious beliefs about death and Positive therapeutic relationship  Continued Clinical Symptoms:  Depression:   Anhedonia Hopelessness Alcohol/Substance Abuse/Dependencies  Cognitive Features That Contribute To Risk:  Loss of executive function    Suicide Risk:  Mild:  Suicidal ideation of limited frequency, intensity, duration, and specificity.  There are no identifiable plans, no associated intent, mild dysphoria and related symptoms, good self-control (both objective and subjective assessment), few other risk factors, and identifiable protective factors, including available and accessible social support.  Follow-up Information    [facility] Go on 06/17/2016.   Why:  Report to [facility] program on this date for long term substance and alcohol use treatment. Contact for this program is Michaell Cowing at [place]: [phone].  Contact information: [address] [place] [address] [phone]       [facility] Go in 7 day(s).   Why:  Follow-up with [facility]  within 7 days of discharge for outpatient services including medication management and outpatient therapy in the event you do not go to [facility] for treatment at [facility].  Contact information: [address]  [place] [address] [phone]           Plan Of Care/Follow-up recommendations:  Activity:  Activity as tolerated Diet:  Heart healthy diet Other:  Patient is planning  to stay with a substance abuse counselor overnight and then go to [place] tomorrow for several months of inpatient substance abuse treatment. Feels optimistic about the plan. Suicide risk is largely dependent on drug abuse. Patient has a solid plan for substance abuse treatment at discharge.  Mordecai RasmussenJohn Oren Barella, MD 06/16/2016, 11:26 AM

## 2016-06-16 NOTE — Discharge Summary (Signed)
Physician Discharge Summary Note  Patient:  Greg RicherKenneth Moreno is an 41 y.o., male MRN:  [id number] DOB:  11/26/1975 Patient phone:  [phone] (home)  [facility] [place] [id number],  Total Time spent with patient: 45 minutes  Date of Admission:  06/13/2016 Date of Discharge: 06/16/2016  Reason for Admission:  Patient was admitted through the emergency room because of relapse into cocaine abuse and alcohol abuse accompanied by a return of severe depressive symptoms with suicidal ideation.  Principal Problem: Severe recurrent major depression without psychotic features [facility](HCC) Discharge Diagnoses: Patient Active Problem List   Diagnosis Date Noted  . 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 Tobacco use disorder [F17.200] 06/13/2016  . Substance induced mood disorder (HCC) [F19.94] 06/13/2016  . Cocaine use disorder, severe, dependence (HCC) [F14.20] 05/28/2016  . Alcohol use disorder, severe, dependence (HCC) [F10.20] 05/28/2016    Past Psychiatric History: Patient has a past history of substance abuse but also a past history of long-term sobriety. Had a recent hospitalization with similar circumstances because of intense suicidal thought. Had been treated with antidepressants in the past without significant improvement. Mood improvement often largely dependent on drug and alcohol abuse.  Past Medical History:  Past Medical History:  Diagnosis Date  . Polysubstance abuse    History reviewed. No pertinent surgical history. Family History: History reviewed. No pertinent family history. Family Psychiatric  History: Positive for substance abuse Social History:  History  Alcohol Use  . Yes    Comment: 1/5 every day     History  Drug Use  . Types: Cocaine    Comment: Pt states smokes crack cocaine    Social History   Social History  . Marital status: Single    Spouse name: N/A  . Number of children: N/A  . Years  of education: N/A   Social History Main Topics  . Smoking status: Current Every Day Smoker    Types: Cigarettes  . Smokeless tobacco: Current User    Types: Snuff, Chew  . Alcohol use Yes     Comment: 1/5 every day  . Drug use: Yes    Types: Cocaine     Comment: Pt states smokes crack cocaine  . Sexual activity: Not Asked   Other Topics Concern  . None   Social History Narrative  . None    Hospital Course:  Patient had brief detox which was tolerated without difficulty. Patient was complaining of a cough and cold for much of his hospital stay which limited his group interaction. On direct individual assessment however he reports his mood is better. He completely denies suicidal ideation. Has made arrangements to set up treatment [place] in [place]. No sign of any suicidality. Patient was continued on antibiotics which will be given to him at discharge for the remaining 3 days of treatment course. Daily individual and group counseling focusing on substance use issues.  Physical Findings: AIMS:  , ,  ,  ,    CIWA:  CIWA-Ar Total: 0 COWS:     Musculoskeletal: Strength & Muscle Tone: within normal limits Gait & Station: normal Patient leans: N/A  Psychiatric Specialty Exam: Physical Exam  Nursing note and vitals reviewed. Constitutional: He appears well-developed and well-nourished.  HENT:  Head: Normocephalic and atraumatic.  Eyes: Conjunctivae are normal. Pupils are equal, round, and reactive to light.  Neck: Normal range of motion.  Cardiovascular: Regular rhythm and normal heart sounds.   Respiratory: Effort normal.  No respiratory distress.  GI: Soft.  Musculoskeletal: Normal range of motion.  Neurological: He is alert.  Skin: Skin is warm and dry.  Psychiatric: Judgment normal. His mood appears anxious. His speech is delayed. He is slowed. Thought content is not paranoid. Cognition and memory are normal. He expresses no homicidal and no suicidal ideation.     Review of Systems  Constitutional: Negative.   HENT: Negative.   Eyes: Negative.   Respiratory: Positive for cough.   Cardiovascular: Negative.   Gastrointestinal: Negative.   Musculoskeletal: Negative.   Skin: Negative.   Neurological: Negative.   Psychiatric/Behavioral: Positive for depression and substance abuse. Negative for hallucinations, memory loss and suicidal ideas. The patient is nervous/anxious. The patient does not have insomnia.     Blood pressure 119/87, pulse 85, temperature 98.6 F (37 C), temperature source Oral, resp. rate 18, height 6' (1.829 m), weight 118.8 kg (262 lb), SpO2 99 %.Body mass index is 35.53 kg/m.  General Appearance: Casual  Eye Contact:  Good  Speech:  Slow  Volume:  Decreased  Mood:  Anxious  Affect:  Congruent  Thought Process:  Goal Directed  Orientation:  Full (Time, Place, and Person)  Thought Content:  Logical  Suicidal Thoughts:  No  Homicidal Thoughts:  No  Memory:  Immediate;   Fair Recent;   Fair Remote;   Fair  Judgement:  Fair  Insight:  Fair  Psychomotor Activity:  Normal  Concentration:  Concentration: Fair  Recall:  Fair  Fund of Knowledge:  Fair  Language:  Fair  Akathisia:  No  Handed:  Right  AIMS (if indicated):     Assets:  Communication Skills Desire for Improvement Physical Health Resilience Social Support  ADL's:  Intact  Cognition:  WNL  Sleep:  Number of Hours: 7.3     Have you used any form of tobacco in the last 30 days? (Cigarettes, Smokeless Tobacco, Cigars, and/or Pipes): Yes  Has this patient used any form of tobacco in the last 30 days? (Cigarettes, Smokeless Tobacco, Cigars, and/or Pipes) Yes, No  Blood Alcohol level:  Lab Results  Component Value Date   ETH 29 (H) 06/12/2016   ETH 16 (H) 05/26/2016    Metabolic Disorder Labs:  Lab Results  Component Value Date   HGBA1C 5.5 05/28/2016   MPG 111 05/28/2016   No results found for: PROLACTIN Lab Results  Component Value Date    CHOL 137 05/28/2016   TRIG 97 05/28/2016   HDL 42 05/28/2016   CHOLHDL 3.3 05/28/2016   VLDL 19 05/28/2016   LDLCALC 76 05/28/2016    See Psychiatric Specialty Exam and Suicide Risk Assessment completed by Attending Physician prior to discharge.  Discharge destination:  Home  Is patient on multiple antipsychotic therapies at discharge:  No   Has Patient had three or more failed trials of antipsychotic monotherapy by history:  No  Recommended Plan for Multiple Antipsychotic Therapies: NA  Discharge Instructions    Diet - low sodium heart healthy    Complete by:  As directed    Diet - low sodium heart healthy    Complete by:  As directed    Diet - low sodium heart healthy    Complete by:  As directed    Increase activity slowly    Complete by:  As directed    Increase activity slowly    Complete by:  As directed    Increase activity slowly    Complete by:  As directed  Allergies as of 06/16/2016   No Known Allergies     Medication List    TAKE these medications     Indication  azithromycin 250 MG tablet Commonly known as:  ZITHROMAX As directed  Indication:  Infection caused by Bacteria   meloxicam 7.5 MG tablet Commonly known as:  MOBIC Take 1 tablet (7.5 mg total) by mouth 2 (two) times daily.  Indication:  Joint Damage causing Pain and Loss of Function      Follow-up Information    [facility]. Go on 06/17/2016.   Why:  Report to [facility] program on this date for long term substance and alcohol use treatment. Contact for this program is Michaell Cowing at [employer]: [phone].  Contact information: [address] [place] [address] [phone]       [facility] Go in 7 day(s).   Why:  Follow-up with [facility]  within 7 days of discharge for outpatient services including medication management and outpatient therapy in the event you do not go to [facility] for treatment at [facility].  Contact  information: [address] [place] [address] [phone]           Follow-up recommendations:  Activity:  Activity as tolerated Diet:  Heart healthy diet Other:  Patient is being discharged to the company of a substance abuse counselor who will assist him in eating on the plane to go to [place] tomorrow. Patient has a solid plan for substance abuse treatment. He will be given a prescription for his azithromycin. Description will not be given for the Celexa because he is not going to be allowed to take any psychiatric medicine at the facility where he is. Patient is agreeable to the plan. Case reviewed with nursing and social work.  Comments:  See note above. Patient discharged this afternoon with a clear plan to go to substance abuse treatment tomorrow.  Signed: Mordecai Rasmussen, MD 06/16/2016, 11:29 AM

## 2016-06-16 NOTE — [facility] Group Notes (Signed)
[facility] LCSW Group Therapy  06/16/2016 2:42 PM  Type of Therapy:  Group Therapy  Participation Level:  Minimal  Participation Quality:  Attentive  Affect:  Appropriate  Cognitive:  Alert  Insight:  Improving  Engagement in Therapy:  Engaged  Modes of Intervention:  Activity, Clarification, Discussion, Education, Exploration, Limit-setting, Problem-solving, Reality Testing, Socialization and Support  Summary of Progress/Problems: Coping Skills: Patients defined and discussed healthy coping skills. Patients identified healthy coping skills they would like to try during hospitalization and after discharge. CSW offered insight to varying coping skills that may have been new to patients such as practicing mindfulness. Patient came to group on this date but left shortly after to be discharged.   Alessio Bogan G. Garnette CzechSampson MSW, LCSWA 06/16/2016, 2:43 PM

## 2016-06-16 NOTE — Progress Notes (Signed)
Denies SI/HI/AVH.  Verbalizes that he feels much better.  Rates depression as 1/10.  Discharge instructions given, verbalized understanding. Prescription given and personal belongings returned.  Escorted off unit by staff member to main entrance to travel home with family.

## 2016-06-16 NOTE — Progress Notes (Signed)
Patient ID: Greg RicherKenneth Moreno, male   DOB: 03/15/1975, 41 y.o.   MRN: [id number]  CSW spoke with Council MechanicJay Doss, friend of patient, [phone] and informed him that patient was being discharged today. Mr. Tami RibasDoss informed CSW that patient[place] [facility] [place] had already been purchased and that the treatment program will pick patient up at the airport on Monday. Mr. Tami RibasDoss had no further questions for CSW at this time.   CSW spoke with patient and discussed his discharge plan. Patient had no further questions for CSW at this time.   Dilcia Rybarczyk G. Garnette CzechSampson MSW, LCSWA 06/16/2016 11:26 AM

## 2016-06-16 NOTE — Plan of Care (Signed)
Problem: Self-Concept: Goal: Level of anxiety will decrease Outcome: Progressing Pt noted to be less anxious thank previously noted

## 2016-07-22 ENCOUNTER — Ambulatory Visit: Payer: Self-pay | Admitting: Pharmacy Technician

## 2016-07-22 NOTE — Progress Notes (Signed)
Patient scheduled for eligibility appointment at [facility].  Patient did not show for the appointment on July 22, 2016 at 10:30a.m.  Patient did not reschedule eligibility appointment.  [facility] unable to provide additional medication assistance until eligibility is determined.  Sherilyn DacostaBetty J. Berley Gambrell Care Manager [facility]

## 2017-07-10 IMAGING — CR DG CHEST 2V
1 series · 2 of 2 positions shown · non-contrast
Comparison: None.

CLINICAL DATA: Cough and congestion

EXAM:
CHEST  2 VIEW

[Series 1: dg chest 2 view · 0.14mm/px · 2 of 2 slices shown]
[im 1/2]
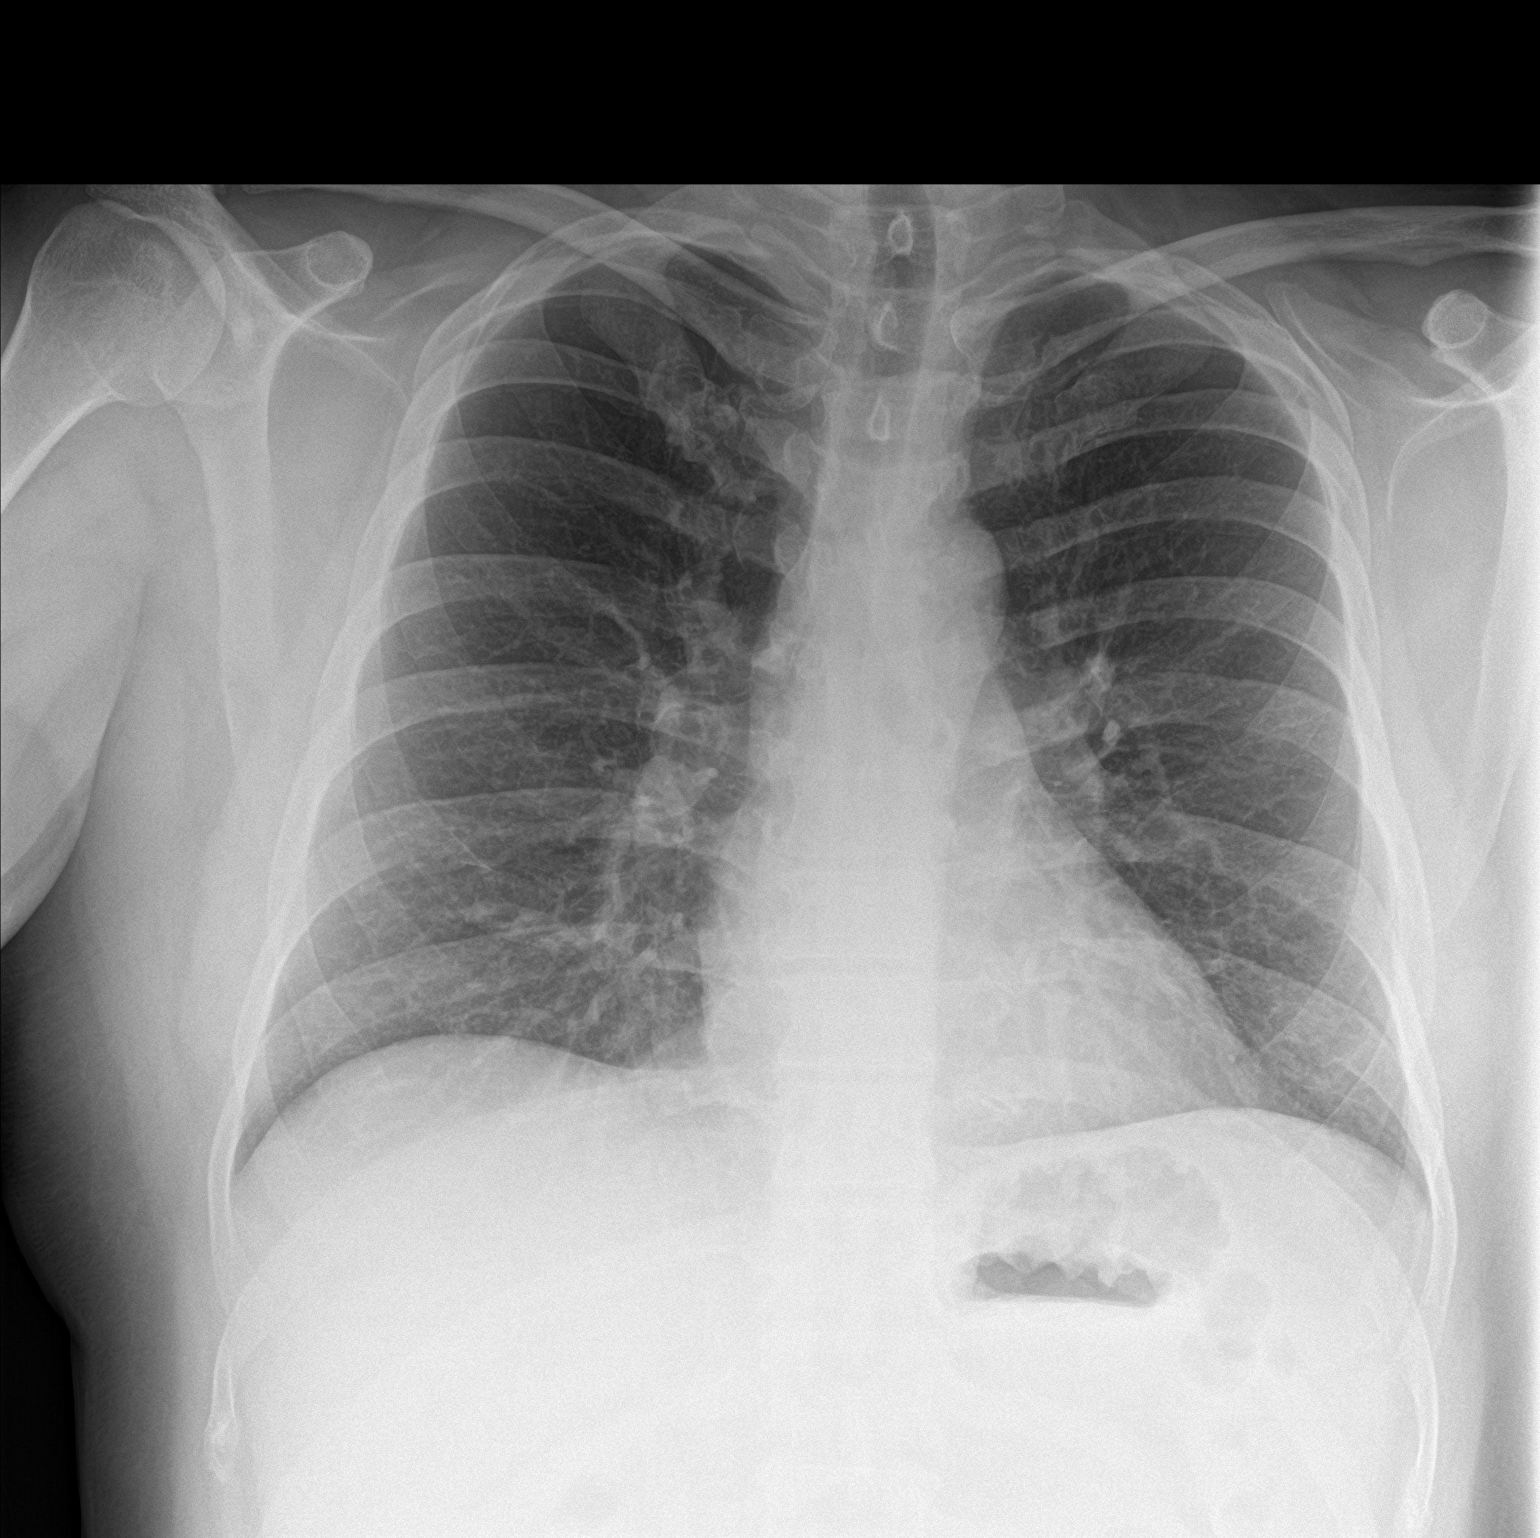
[im 2/2]
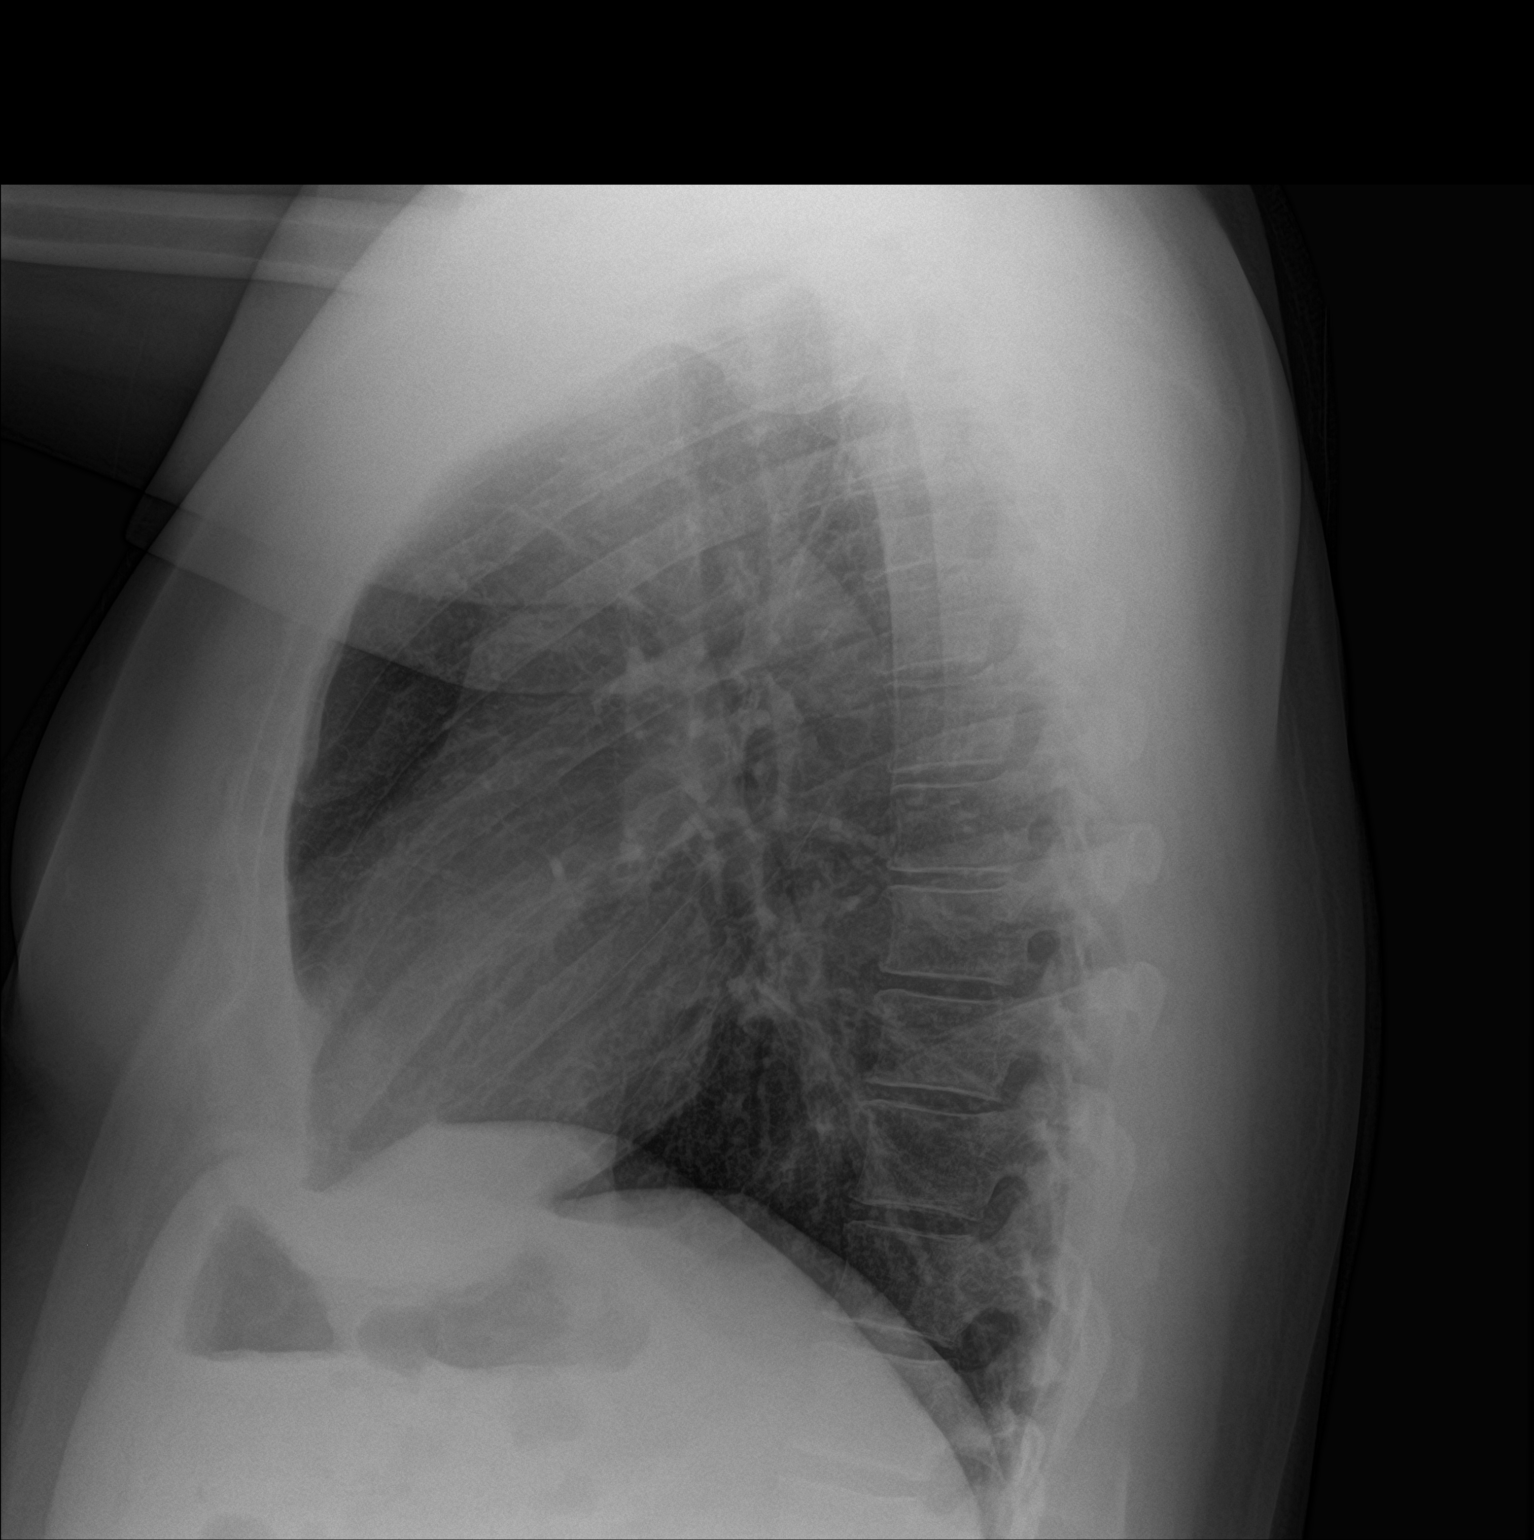

[2 of 2 positions shown; findings below may reference images not displayed]

FINDINGS: The heart size and mediastinal contours are within normal limits.
Both lungs are clear. The visualized skeletal structures are
unremarkable.
IMPRESSION: No active cardiopulmonary disease.

## 2018-04-23 ENCOUNTER — Ambulatory Visit: Payer: Self-pay
